# Patient Record
Sex: Female | Born: 1975 | Hispanic: Yes | Marital: Married | Smoking: Never smoker
Health system: Southern US, Community
[De-identification: ages and names within clinical notes are randomized; demographics above are authoritative.]

## PROBLEM LIST (undated history)

## (undated) DIAGNOSIS — R1900 Intra-abdominal and pelvic swelling, mass and lump, unspecified site: Secondary | ICD-10-CM

## (undated) DIAGNOSIS — I10 Essential (primary) hypertension: Secondary | ICD-10-CM

## (undated) DIAGNOSIS — K5900 Constipation, unspecified: Secondary | ICD-10-CM

## (undated) DIAGNOSIS — F329 Major depressive disorder, single episode, unspecified: Secondary | ICD-10-CM

## (undated) DIAGNOSIS — F32A Depression, unspecified: Secondary | ICD-10-CM

## (undated) DIAGNOSIS — O24419 Gestational diabetes mellitus in pregnancy, unspecified control: Secondary | ICD-10-CM

---

## 1997-12-25 ENCOUNTER — Encounter: Admission: RE | Admit: 1997-12-25 | Discharge: 1998-03-25 | Payer: Self-pay | Admitting: Obstetrics and Gynecology

## 1998-02-22 ENCOUNTER — Inpatient Hospital Stay (HOSPITAL_COMMUNITY): Admission: AD | Admit: 1998-02-22 | Discharge: 1998-02-22 | Payer: Self-pay | Admitting: Obstetrics & Gynecology

## 1998-02-23 ENCOUNTER — Inpatient Hospital Stay (HOSPITAL_COMMUNITY): Admission: AD | Admit: 1998-02-23 | Discharge: 1998-02-23 | Payer: Self-pay | Admitting: Obstetrics & Gynecology

## 1998-02-23 ENCOUNTER — Inpatient Hospital Stay (HOSPITAL_COMMUNITY): Admission: AD | Admit: 1998-02-23 | Discharge: 1998-02-26 | Payer: Self-pay | Admitting: *Deleted

## 2001-06-07 ENCOUNTER — Other Ambulatory Visit: Admission: RE | Admit: 2001-06-07 | Discharge: 2001-06-07 | Payer: Self-pay | Admitting: Obstetrics and Gynecology

## 2001-12-20 ENCOUNTER — Inpatient Hospital Stay (HOSPITAL_COMMUNITY): Admission: AD | Admit: 2001-12-20 | Discharge: 2001-12-20 | Payer: Self-pay | Admitting: *Deleted

## 2002-01-04 ENCOUNTER — Inpatient Hospital Stay (HOSPITAL_COMMUNITY): Admission: AD | Admit: 2002-01-04 | Discharge: 2002-01-06 | Payer: Self-pay | Admitting: *Deleted

## 2002-02-15 ENCOUNTER — Encounter: Admission: RE | Admit: 2002-02-15 | Discharge: 2002-02-15 | Payer: Self-pay | Admitting: *Deleted

## 2002-04-02 ENCOUNTER — Encounter: Admission: RE | Admit: 2002-04-02 | Discharge: 2002-04-02 | Payer: Self-pay | Admitting: *Deleted

## 2002-06-26 ENCOUNTER — Encounter: Admission: RE | Admit: 2002-06-26 | Discharge: 2002-06-26 | Payer: Self-pay | Admitting: Obstetrics and Gynecology

## 2002-09-25 ENCOUNTER — Encounter: Admission: RE | Admit: 2002-09-25 | Discharge: 2002-09-25 | Payer: Self-pay | Admitting: Obstetrics and Gynecology

## 2002-12-23 ENCOUNTER — Encounter: Admission: RE | Admit: 2002-12-23 | Discharge: 2002-12-23 | Payer: Self-pay | Admitting: Obstetrics and Gynecology

## 2003-03-18 ENCOUNTER — Encounter: Admission: RE | Admit: 2003-03-18 | Discharge: 2003-03-18 | Payer: Self-pay | Admitting: Obstetrics and Gynecology

## 2007-08-04 ENCOUNTER — Emergency Department (HOSPITAL_COMMUNITY): Admission: EM | Admit: 2007-08-04 | Discharge: 2007-08-04 | Payer: Self-pay | Admitting: Emergency Medicine

## 2009-05-13 ENCOUNTER — Encounter
Admission: RE | Admit: 2009-05-13 | Discharge: 2010-05-18 | Payer: Self-pay | Source: Home / Self Care | Attending: Obstetrics | Admitting: Obstetrics

## 2010-05-13 ENCOUNTER — Ambulatory Visit (HOSPITAL_COMMUNITY)
Admission: RE | Admit: 2010-05-13 | Discharge: 2010-05-13 | Payer: Self-pay | Source: Home / Self Care | Attending: Obstetrics | Admitting: Obstetrics

## 2010-07-21 ENCOUNTER — Inpatient Hospital Stay (HOSPITAL_COMMUNITY)
Admission: AD | Admit: 2010-07-21 | Discharge: 2010-07-21 | Disposition: A | Discharge status: Home / Self Care | Payer: Self-pay | Source: Ambulatory Visit | Attending: Obstetrics | Admitting: Obstetrics

## 2010-07-21 DIAGNOSIS — R030 Elevated blood-pressure reading, without diagnosis of hypertension: Secondary | ICD-10-CM | POA: Insufficient documentation

## 2010-07-21 DIAGNOSIS — O9989 Other specified diseases and conditions complicating pregnancy, childbirth and the puerperium: Secondary | ICD-10-CM

## 2010-07-21 DIAGNOSIS — O99891 Other specified diseases and conditions complicating pregnancy: Secondary | ICD-10-CM | POA: Insufficient documentation

## 2010-07-21 LAB — COMPREHENSIVE METABOLIC PANEL
ALT: 15 U/L (ref 0–35)
AST: 15 U/L (ref 0–37)
CO2: 23 mEq/L (ref 19–32)
Calcium: 9.1 mg/dL (ref 8.4–10.5)
GFR calc Af Amer: >60 mL/min (ref >60)
GFR calc non Af Amer: >60 mL/min (ref >60)
Sodium: 135 mEq/L (ref 135–145)

## 2010-07-21 LAB — CBC
Hemoglobin: 13.8 g/dL (ref 12.0–15.0)
MCHC: 34.2 g/dL (ref 30.0–36.0)
RBC: 4.22 MIL/uL (ref 3.87–5.11)
WBC: 9 10*3/uL (ref 4.0–10.5)

## 2010-07-28 ENCOUNTER — Inpatient Hospital Stay (HOSPITAL_COMMUNITY)
Admission: AD | Admit: 2010-07-28 | Discharge: 2010-07-28 | Disposition: A | Discharge status: Home / Self Care | Payer: Self-pay | Source: Ambulatory Visit | Attending: Obstetrics | Admitting: Obstetrics

## 2010-07-28 DIAGNOSIS — O139 Gestational [pregnancy-induced] hypertension without significant proteinuria, unspecified trimester: Secondary | ICD-10-CM | POA: Insufficient documentation

## 2010-07-28 LAB — COMPREHENSIVE METABOLIC PANEL
BUN: 7 mg/dL (ref 6–23)
CO2: 21 mEq/L (ref 19–32)
Chloride: 104 mEq/L (ref 96–112)
Creatinine, Ser: 0.53 mg/dL (ref 0.4–1.2)
GFR calc non Af Amer: >60 mL/min (ref >60)
Glucose, Bld: 70 mg/dL (ref 70–99)
Total Bilirubin: 0.2 mg/dL — ABNORMAL LOW (ref 0.3–1.2)

## 2010-07-28 LAB — CBC
Hemoglobin: 14.1 g/dL (ref 12.0–15.0)
MCH: 32.4 pg (ref 26.0–34.0)
MCV: 95.2 fL (ref 78.0–100.0)
Platelets: 196 10*3/uL (ref 150–400)
RBC: 4.35 MIL/uL (ref 3.87–5.11)

## 2010-07-28 LAB — URIC ACID: Uric Acid, Serum: 5.1 mg/dL (ref 2.4–7.0)

## 2010-08-05 ENCOUNTER — Inpatient Hospital Stay (HOSPITAL_COMMUNITY)
Admission: RE | Admit: 2010-08-05 | Discharge: 2010-08-07 | DRG: 774 | Disposition: A | Discharge status: Home / Self Care | Payer: Self-pay | Source: Ambulatory Visit | Attending: Obstetrics | Admitting: Obstetrics

## 2010-08-05 DIAGNOSIS — O2432 Unspecified pre-existing diabetes mellitus in childbirth: Principal | ICD-10-CM | POA: Diagnosis present

## 2010-08-05 DIAGNOSIS — E119 Type 2 diabetes mellitus without complications: Secondary | ICD-10-CM | POA: Diagnosis present

## 2010-08-05 LAB — CBC
HCT: 40 % (ref 36.0–46.0)
Hemoglobin: 13.7 g/dL (ref 12.0–15.0)
MCH: 32.5 pg (ref 26.0–34.0)
MCHC: 34.3 g/dL (ref 30.0–36.0)
MCV: 95 fL (ref 78.0–100.0)
RBC: 4.21 MIL/uL (ref 3.87–5.11)
WBC: 10.2 10*3/uL (ref 4.0–10.5)

## 2010-08-05 LAB — GLUCOSE, RANDOM: Glucose, Bld: 131 mg/dL — ABNORMAL HIGH (ref 70–99)

## 2010-08-07 LAB — CBC
Hemoglobin: 12.1 g/dL (ref 12.0–15.0)
MCV: 96.5 fL (ref 78.0–100.0)
Platelets: 164 10*3/uL (ref 150–400)
RBC: 3.76 MIL/uL — ABNORMAL LOW (ref 3.87–5.11)
WBC: 8.3 10*3/uL (ref 4.0–10.5)

## 2010-08-09 ENCOUNTER — Inpatient Hospital Stay (HOSPITAL_COMMUNITY): Admission: AD | Admit: 2010-08-09 | Payer: Self-pay | Source: Ambulatory Visit | Admitting: Obstetrics

## 2010-09-19 ENCOUNTER — Emergency Department (HOSPITAL_COMMUNITY)
Admission: EM | Admit: 2010-09-19 | Discharge: 2010-09-19 | Disposition: A | Discharge status: Home / Self Care | Payer: Self-pay | Attending: Emergency Medicine | Admitting: Emergency Medicine

## 2010-09-19 DIAGNOSIS — Z79899 Other long term (current) drug therapy: Secondary | ICD-10-CM | POA: Insufficient documentation

## 2010-09-19 DIAGNOSIS — R05 Cough: Secondary | ICD-10-CM | POA: Insufficient documentation

## 2010-09-19 DIAGNOSIS — R070 Pain in throat: Secondary | ICD-10-CM | POA: Insufficient documentation

## 2010-09-19 DIAGNOSIS — J069 Acute upper respiratory infection, unspecified: Secondary | ICD-10-CM | POA: Insufficient documentation

## 2010-09-19 DIAGNOSIS — R490 Dysphonia: Secondary | ICD-10-CM | POA: Insufficient documentation

## 2010-09-19 DIAGNOSIS — R059 Cough, unspecified: Secondary | ICD-10-CM | POA: Insufficient documentation

## 2010-09-19 DIAGNOSIS — I10 Essential (primary) hypertension: Secondary | ICD-10-CM | POA: Insufficient documentation

## 2010-09-19 DIAGNOSIS — H9209 Otalgia, unspecified ear: Secondary | ICD-10-CM | POA: Insufficient documentation

## 2011-01-14 LAB — PROCEDURE REPORT - SCANNED: Pap: NEGATIVE

## 2011-11-22 ENCOUNTER — Encounter (HOSPITAL_COMMUNITY): Payer: Self-pay | Admitting: Emergency Medicine

## 2011-11-22 ENCOUNTER — Emergency Department (HOSPITAL_COMMUNITY): Payer: Self-pay

## 2011-11-22 ENCOUNTER — Emergency Department (HOSPITAL_COMMUNITY)
Admission: EM | Admit: 2011-11-22 | Discharge: 2011-11-22 | Disposition: A | Discharge status: Home / Self Care | Payer: Self-pay | Attending: Emergency Medicine | Admitting: Emergency Medicine

## 2011-11-22 DIAGNOSIS — I10 Essential (primary) hypertension: Secondary | ICD-10-CM | POA: Insufficient documentation

## 2011-11-22 DIAGNOSIS — R109 Unspecified abdominal pain: Secondary | ICD-10-CM

## 2011-11-22 DIAGNOSIS — N83209 Unspecified ovarian cyst, unspecified side: Secondary | ICD-10-CM | POA: Insufficient documentation

## 2011-11-22 DIAGNOSIS — N83201 Unspecified ovarian cyst, right side: Secondary | ICD-10-CM

## 2011-11-22 HISTORY — DX: Essential (primary) hypertension: I10

## 2011-11-22 LAB — URINE MICROSCOPIC-ADD ON

## 2011-11-22 LAB — WET PREP, GENITAL: Yeast Wet Prep HPF POC: NONE SEEN

## 2011-11-22 LAB — POCT I-STAT, CHEM 8
Calcium, Ion: 1.17 mmol/L (ref 1.12–1.23)
Glucose, Bld: 101 mg/dL — ABNORMAL HIGH (ref 70–99)
HCT: 43 % (ref 36.0–46.0)
TCO2: 20 mmol/L (ref 0–100)

## 2011-11-22 LAB — URINALYSIS, ROUTINE W REFLEX MICROSCOPIC
Bilirubin Urine: NEGATIVE
Glucose, UA: NEGATIVE mg/dL
Glucose, UA: NEGATIVE mg/dL
Leukocytes, UA: NEGATIVE
Specific Gravity, Urine: 1.03 (ref 1.005–1.030)
pH: 6.5 (ref 5.0–8.0)
pH: 6 (ref 5.0–8.0)

## 2011-11-22 LAB — CBC WITH DIFFERENTIAL/PLATELET
Basophils Absolute: 0 10*3/uL (ref 0.0–0.1)
Basophils Relative: 0 % (ref 0–1)
Eosinophils Relative: 0 % (ref 0–5)
HCT: 38.8 % (ref 36.0–46.0)
Lymphocytes Relative: 9 % — ABNORMAL LOW (ref 12–46)
MCHC: 35.1 g/dL (ref 30.0–36.0)
Monocytes Absolute: 0.6 10*3/uL (ref 0.1–1.0)
Neutro Abs: 14.8 10*3/uL — ABNORMAL HIGH (ref 1.7–7.7)
Platelets: 212 10*3/uL (ref 150–400)
RDW: 13.4 % (ref 11.5–15.5)
WBC: 17 10*3/uL — ABNORMAL HIGH (ref 4.0–10.5)

## 2011-11-22 LAB — POCT PREGNANCY, URINE: Preg Test, Ur: NEGATIVE

## 2011-11-22 MED ORDER — HYDROCODONE-ACETAMINOPHEN 5-325 MG PO TABS: ORAL_TABLET | ORAL | Status: AC

## 2011-11-22 MED ORDER — IOHEXOL 300 MG/ML  SOLN
100.0000 mL | Freq: Once | INTRAMUSCULAR | Status: AC | PRN
  Administered 2011-11-22: 100 mL via INTRAVENOUS

## 2011-11-22 MED ORDER — LIDOCAINE HCL 1 % IJ SOLN
INTRAMUSCULAR | Status: AC
  Administered 2011-11-22: 20 mL
  Filled 2011-11-22: qty 20

## 2011-11-22 MED ORDER — CEFTRIAXONE SODIUM 250 MG IJ SOLR
250.0000 mg | Freq: Once | INTRAMUSCULAR | Status: AC
  Administered 2011-11-22: 250 mg via INTRAMUSCULAR
  Filled 2011-11-22: qty 250

## 2011-11-22 MED ORDER — IBUPROFEN 800 MG PO TABS
800.0000 mg | ORAL_TABLET | Freq: Once | ORAL | Status: AC
  Administered 2011-11-22: 800 mg via ORAL
  Filled 2011-11-22: qty 1

## 2011-11-22 MED ORDER — AZITHROMYCIN 250 MG PO TABS: ORAL_TABLET | ORAL | Status: AC

## 2011-11-22 MED ORDER — AZITHROMYCIN 250 MG PO TABS
1000.0000 mg | ORAL_TABLET | Freq: Once | ORAL | Status: AC
  Administered 2011-11-22: 1000 mg via ORAL
  Filled 2011-11-22: qty 4

## 2011-11-22 NOTE — ED Provider Notes (Signed)
Medical screening examination/treatment/procedure(s) were performed by non-physician practitioner and as supervising physician I was immediately available for consultation/collaboration.  Jasmine Awe, MD 11/22/11 [phone]

## 2011-11-22 NOTE — ED Provider Notes (Signed)
History     CSN: [id number]  Arrival date & time 11/22/11  4098   First MD Initiated Contact with Patient 11/22/11 0324      Chief Complaint  Patient presents with  . Abdominal Pain    (Consider location/radiation/quality/duration/timing/severity/associated sxs/prior treatment) HPI  Past Medical History  Diagnosis Date  . Hypertension     History reviewed. No pertinent past surgical history.  History reviewed. No pertinent family history.  History  Substance Use Topics  . Smoking status: Never Smoker   . Smokeless tobacco: Not on file  . Alcohol Use: No    OB History    Grav Para Term Preterm Abortions TAB SAB Ect Mult Living                  Review of Systems  Allergies  Review of patient's allergies indicates no known allergies.  Home Medications   Current Outpatient Rx  Name Route Sig Dispense Refill  . HYDROCHLOROTHIAZIDE 12.5 MG PO CAPS Oral Take 12.5 mg by mouth daily.      BP 121/79  Pulse 71  Temp 97.6 F (36.4 C) (Oral)  Resp 20  SpO2 97%  LMP 11/20/2011  Physical Exam  ED Course  Procedures (including critical care time)  Labs Reviewed  URINALYSIS, ROUTINE W REFLEX MICROSCOPIC - Abnormal; Notable for the following:    APPearance CLOUDY (*)     Hgb urine dipstick LARGE (*)     Ketones, ur TRACE (*)     Leukocytes, UA MODERATE (*)     All other components within normal limits  URINE MICROSCOPIC-ADD ON - Abnormal; Notable for the following:    Squamous Epithelial / LPF FEW (*)     All other components within normal limits  POCT I-STAT, CHEM 8 - Abnormal; Notable for the following:    Potassium 3.3 (*)     Glucose, Bld 101 (*)     All other components within normal limits  CBC WITH DIFFERENTIAL - Abnormal; Notable for the following:    WBC 17.0 (*)     Neutrophils Relative 88 (*)     Neutro Abs 14.8 (*)     Lymphocytes Relative 9 (*)     All other components within normal limits  WET PREP, GENITAL - Abnormal; Notable for the  following:    Clue Cells Wet Prep HPF POC FEW (*)     WBC, Wet Prep HPF POC MANY (*)     All other components within normal limits  URINALYSIS, ROUTINE W REFLEX MICROSCOPIC - Abnormal; Notable for the following:    Hgb urine dipstick TRACE (*)     All other components within normal limits  URINE MICROSCOPIC-ADD ON - Abnormal; Notable for the following:    Squamous Epithelial / LPF FEW (*)     All other components within normal limits  POCT PREGNANCY, URINE  GC/CHLAMYDIA PROBE AMP, GENITAL   Ct Abdomen Pelvis W Contrast  11/22/2011  *RADIOLOGY REPORT*  Clinical Data: Lower abdominal pain.  Nausea.  CT ABDOMEN AND PELVIS WITH CONTRAST  Technique:  Multidetector CT imaging of the abdomen and pelvis was performed following the standard protocol during bolus administration of intravenous contrast.  Contrast: OMNIPAQUE IOHEXOL 300 MG/ML  SOLN  Comparison: None.  Findings: There is a 3.5 cm cyst on the left ovary with an oblong 2.1 x 1.0 cm cyst also in the left ovary.  There is a 2.4 cm cyst on the right ovary. There is edema and increased  vascularity in the adjacent omentum particularly slightly to the right.  There is no focal nodularity.  The adjacent sigmoid portion of the colon, uterus, and bladder demonstrate no significant abnormalities.  No osseous abnormality.  Liver, spleen, pancreas, and adrenal glands are normal.  13 mm cyst in the lower pole of the right kidney.  Tiny stone in the upper pole of the right kidney.  The bowel is normal including the terminal ileum and appendix.  IMPRESSION:  1.  Abnormal appearance of both ovaries with multiple cystic lesions. 2.  Abnormal soft tissue stranding and prominent vascularity in the omentum adjacent to the ovaries and uterus. 3.  I recommend correlating the abnormalities with ovarian tumor markers.  Does the patient have any symptoms of pelvic inflammatory disease?  Original Report Authenticated By: Gwynn Burly, M.D.     1. Abdominal pain    2. Bilateral ovarian cysts     6:53 AM Handoff from [facility]. Pending in and out cath to test urine (patient is menstruating). Pt has WBC count, no cervical motion or adnexal tenderness. If UA does not demonstrate infection to explain WBC count, will need CT abd/pelvis.   Vital signs reviewed and are as follows: Filed Vitals:   11/22/11 0157  BP: 121/79  Pulse: 71  Temp: 97.6 F (36.4 C)  Resp: 20   7:32 AM Patient seen. Informed of need for CT. She states her pain is still there, but mild. She states she is comfortable and does not want additional medicine.   7:33 AM Exam:  Gen NAD; Heart RRR, nml S1,S2, no m/r/g; Lungs CTAB; Abd soft, NT, no rebound or guarding; Ext 2+ pedal pulses bilaterally, no edema.  10:50 AM CT reviewed. Patient informed of results. She has a doctor and will follow-up regarding CT findings. Patient appears well and she is eating a sandwich in room. Instructed to take additional dose azithro in 1 week. She understands.   10:52 AM The patient was urged to return to the Emergency Department immediately with worsening of current symptoms, worsening abdominal pain, persistent vomiting, blood noted in stools, fever, or any other concerns. The patient verbalized understanding.   10:52 AM Patient counseled on use of narcotic pain medications. Counseled not to combine these medications with others containing tylenol. Urged not to drink alcohol, drive, or perform any other activities that requires focus while taking these medications. The patient verbalizes understanding and agrees with the plan.    MDM  Ovarian cysts, possible PID on CT. GYN follow-up indicated given ovarian cysts. Will treat as PID given CT findings. Patient much improved in ED, she appears well. D/c to home.         Renne Crigler, [place] 11/22/11 1105

## 2011-11-22 NOTE — ED Notes (Signed)
Pt is c/o lower abd pain that started a couple hours ago   Pt is currently on her period states it started 2 days ago  Pt states it is normal for her   Denies constipation or problems urinating  Pt states she has been sweating with the pain   Pt is crying in triage

## 2011-11-22 NOTE — ED Provider Notes (Signed)
History     CSN: [id number]  Arrival date & time 11/22/11  1191   First MD Initiated Contact with Patient 11/22/11 0324      Chief Complaint  Patient presents with  . Abdominal Pain    (Consider location/radiation/quality/duration/timing/severity/associated sxs/prior treatment) HPI History from patient. 36 year old female who presents with abdominal pain. She reports this started suddenly while at home this evening. Pain is located to the suprapubic area and does not seem to radiate. It is sharp in nature. Worsens with palpation over the area and improves with rest. No treatment prior to coming here. She has had associated nausea. Denies vomiting, changes in bowel movements, urinary symptoms, vaginal discharge. She is currently menstruating. No fever or chills or changes in appetite. No history of abdominal surgeries.  Past Medical History  Diagnosis Date  . Hypertension     History reviewed. No pertinent past surgical history.  History reviewed. No pertinent family history.  History  Substance Use Topics  . Smoking status: Never Smoker   . Smokeless tobacco: Not on file  . Alcohol Use: No    OB History    Grav Para Term Preterm Abortions TAB SAB Ect Mult Living                  Review of Systems as per history of present illness, 10 point review of systems otherwise negative  Allergies  Review of patient's allergies indicates no known allergies.  Home Medications   Current Outpatient Rx  Name Route Sig Dispense Refill  . HYDROCHLOROTHIAZIDE 12.5 MG PO CAPS Oral Take 12.5 mg by mouth daily.      BP 121/79  Pulse 71  Temp 97.6 F (36.4 C) (Oral)  Resp 20  SpO2 97%  LMP 11/20/2011  Physical Exam  Nursing note and vitals reviewed. Constitutional: She appears well-developed and well-nourished. No distress.  HENT:  Head: Normocephalic and atraumatic.  Eyes:       Normal appearance  Neck: Normal range of motion.  Cardiovascular: Normal rate, regular  rhythm and normal heart sounds.   Pulmonary/Chest: Effort normal and breath sounds normal. She exhibits no tenderness.  Abdominal: Soft. Bowel sounds are normal.       Tenderness to suprapubic area. No tenderness at McBurney's point. No rebound or guarding.  Genitourinary:       Chaperone present during exam. Normal external genitalia. Moderate amount of blood in the vaginal vault. No cervical motion tenderness. Tender to palpation on bimanual at the midline. No adnexal tenderness or masses.  Musculoskeletal: Normal range of motion.  Neurological: She is alert.  Skin: Skin is warm and dry. She is not diaphoretic.  Psychiatric: She has a normal mood and affect.    ED Course  Procedures (including critical care time)  Labs Reviewed  URINALYSIS, ROUTINE W REFLEX MICROSCOPIC - Abnormal; Notable for the following:    APPearance CLOUDY (*)     Hgb urine dipstick LARGE (*)     Ketones, ur TRACE (*)     Leukocytes, UA MODERATE (*)     All other components within normal limits  URINE MICROSCOPIC-ADD ON - Abnormal; Notable for the following:    Squamous Epithelial / LPF FEW (*)     All other components within normal limits  POCT I-STAT, CHEM 8 - Abnormal; Notable for the following:    Potassium 3.3 (*)     Glucose, Bld 101 (*)     All other components within normal limits  CBC WITH DIFFERENTIAL -  Abnormal; Notable for the following:    WBC 17.0 (*)     Neutrophils Relative 88 (*)     Neutro Abs 14.8 (*)     Lymphocytes Relative 9 (*)     All other components within normal limits  POCT PREGNANCY, URINE  WET PREP, GENITAL  GC/CHLAMYDIA PROBE AMP, GENITAL  URINALYSIS, ROUTINE W REFLEX MICROSCOPIC   No results found.   No diagnosis found.    MDM  Patient presents with sudden onset of suprapubic pain at the midline. This started just prior to arrival. Has been associated with nausea. Patient does have a leukocytosis of 17,000. She does have some white cells in her urine sample but  the urine sample is also contaminated by menstrual blood. Plan to perform in and out catheterization on the patient to obtain "clean" urine sample. If this shows obvious evidence of a urinary tract infection, feel the patient can be treated for this. If the patient has no obvious evidence of UTI, feel she will require imaging to rule out other intra-abdominal pathology. Signed out to [place], PA-C.       Grant Fontana, PA-C 11/22/11 [phone]

## 2011-11-22 NOTE — ED Notes (Signed)
PT. IN ct

## 2011-12-02 NOTE — ED Provider Notes (Signed)
Medical screening examination/treatment/procedure(s) were performed by non-physician practitioner and as supervising physician I was immediately available for consultation/collaboration.  Kani Jobson K Elody Kleinsasser-Rasch, MD 12/02/11 2312 

## 2011-12-15 ENCOUNTER — Other Ambulatory Visit: Payer: Self-pay | Admitting: Gynecologic Oncology

## 2011-12-15 ENCOUNTER — Other Ambulatory Visit: Payer: Self-pay | Admitting: *Deleted

## 2011-12-15 ENCOUNTER — Ambulatory Visit: Payer: Self-pay | Attending: Gynecologic Oncology | Admitting: Gynecologic Oncology

## 2011-12-15 ENCOUNTER — Encounter: Payer: Self-pay | Admitting: Gynecologic Oncology

## 2011-12-15 VITALS — BP 128/60 | HR 76 | Temp 97.7°F | Resp 18 | Ht 63.19 in | Wt 206.1 lb

## 2011-12-15 DIAGNOSIS — N83209 Unspecified ovarian cyst, unspecified side: Secondary | ICD-10-CM | POA: Insufficient documentation

## 2011-12-15 DIAGNOSIS — I10 Essential (primary) hypertension: Secondary | ICD-10-CM | POA: Insufficient documentation

## 2011-12-15 DIAGNOSIS — R971 Elevated cancer antigen 125 [CA 125]: Secondary | ICD-10-CM

## 2011-12-15 NOTE — Progress Notes (Signed)
Consult Note: Gyn-Onc  Consult was requested by Dr. Mickel Baas for the evaluation of Brittany [name] 36 y.o. female  CC:  Chief Complaint  Patient presents with  . Ovarian Cyst    New Consult  . Elevated Ca 125    HPI: 36 y/o G3P3 Patient's last menstrual period was 11/20/2011. presented to the ER 11/22/2011 for abdominal pain in the lower abdomen and periumblilical area for one day. Patient denies nausea or vomiting,  Reported fevers and nervousness at the time of presentation.  No diarrhea or constipation. WBC was 17K Patient admitted overnight and discharged. Denies vaginal discharge at the time of persentation, GC and chlymidia subsequently negative.    A CT of the abdomen and pelvis was notable for a 3.5 cm cyst on the left ovary with an oblong 2.1 x 1.0 cm cyst also in the left ovary. There is a 2.4 cm cyst on the right ovary. There is edema and increased vascularity in the adjacent omentum particularly slightly to the right. There is no focal nodularity.    Interval History:Patient without pain since, denies any further febrile episodes.    Current Meds:  Outpatient Encounter Prescriptions as of 12/15/2011  Medication Sig Dispense Refill  . hydrochlorothiazide (MICROZIDE) 12.5 MG capsule Take 12.5 mg by mouth daily.        Allergy: No Known Allergies  Social Hx:   History   Social History  . Marital Status: Married    Spouse Name: N/A    Number of Children: N/A  . Years of Education: N/A   Occupational History  . Not on file.   Social History Main Topics  . Smoking status: Never Smoker   . Smokeless tobacco: Not on file  . Alcohol Use: No  . Drug Use: No  . Sexually Active:    Other Topics Concern  . Not on file   Social History Narrative  . No narrative on file    Past Surgical Hx: No past surgical history on file.  Past Medical Hx:  Past Medical History  Diagnosis Date  . Hypertension     Past Gynecological History: G3P3 NSVD x 3.  Menarche 14  every 28 days.  Uses condoms.  Used OCP and Depo with significant weight gain. Last pap 2012 wnl. Patient's last menstrual period was 11/20/2011.  Family Hx: No family history on file.  Review of Systems:  Constitutional  Feels well,Cardiovascular  No chest pain, shortness of breath, or edema  Pulmonary  No cough or wheeze.  Gastro Intestinal  No nausea, vomitting, or diarrhoea. No bright red blood per rectum, no abdominal pain, change in bowel movement, or constipation.  Genito Urinary  No frequency, urgency, dysuria, no vaginal bleeding or discharge Musculo Skeletal  No myalgia, arthralgia, joint swelling or pain  Neurologic  No weakness, numbness, change in gait,  Psychology  No depression, anxiety, insomnia.   Vitals:  Blood pressure 128/60, pulse 76, temperature 97.7 F (36.5 C), temperature source Oral, resp. rate 18, height 5' 3.19" (1.605 m), weight 206 lb 1.6 oz (93.486 kg), last menstrual period 11/20/2011.  Physical Exam: WD in NAD Neck  Supple NROM, without any enlargements.  Lymph Node Survey No cervical supraclavicular or inguinal adenopathy Cardiovascular  Pulse normal rate, regularity and rhythm.  Lungs  Clear to auscultation bilateraly, without wheezes/crackles/rhonchi. Psychiatry  Alert and oriented to person, place, and time  Abdomen  Normoactive bowel sounds, abdomen soft, non-tender and obese.   Back No CVA tenderness Genito Urinary  Vulva/vagina:  Normal external female genitalia.  No lesions. No discharge or bleeding.  Bladder/urethra:  No lesions or masses  Vagina: well estrogenized, no discharge or bleeding, no CMT  Cervix: Normal appearing, no lesions.  Uterus: Small, mobile, no parametrial involvement or nodularity.  Adnexa: No palpable masses, no cul de sac nodularity. Rectal  Good tone, no masses no cul de sac nodularity.  Extremities  No bilateral cyanosis, clubbing or edema.   Assessment/Plan:  Ms. Brittany [name]  is a 36  y.o.  year old with a CT scan on day 2 of her cycle notable to two oblong cystic ovarian structures and omental edema and avscularity.  WBC at the time of presentation was 17K ad CA 125 elevated to 64.  Transvaginal UTZ on day 10 of the next menstrual cycle 12/20/2011 Repeat CA 125 and CBC on day 10 of her cycle. F/U with GYN Onc 12/20/2011.     Laurette Schimke, MD, PhD 12/15/2011, 9:22 AM

## 2011-12-15 NOTE — Patient Instructions (Addendum)
  Transvaginal UTZ on day 10 of the next menstrual cycle 12/20/2011 Repeat CA 125 and CBC on day 10 of her cycle. F/U with GYN Onc 12/20/2011.      Thank you very much Ms. Faiza Saldana-garcia for allowing me to provide care for you today.  I appreciate your confidence in choosing our Gynecologic Oncology team.  If you have any questions about your visit today please call our office and we will get back to you as soon as possible.  Maryclare Labrador. Gerrad Welker MD., PhD Gynecologic Oncology

## 2011-12-20 ENCOUNTER — Other Ambulatory Visit: Payer: Self-pay | Admitting: *Deleted

## 2011-12-20 ENCOUNTER — Ambulatory Visit: Payer: Self-pay

## 2011-12-20 ENCOUNTER — Ambulatory Visit: Payer: Self-pay | Attending: Gynecologic Oncology | Admitting: Gynecologic Oncology

## 2011-12-20 ENCOUNTER — Ambulatory Visit (HOSPITAL_COMMUNITY)
Admission: RE | Admit: 2011-12-20 | Discharge: 2011-12-20 | Disposition: A | Discharge status: Home / Self Care | Payer: Self-pay | Source: Ambulatory Visit | Attending: Gynecologic Oncology | Admitting: Gynecologic Oncology

## 2011-12-20 ENCOUNTER — Ambulatory Visit (HOSPITAL_COMMUNITY): Payer: Self-pay

## 2011-12-20 ENCOUNTER — Encounter: Payer: Self-pay | Admitting: Gynecologic Oncology

## 2011-12-20 VITALS — BP 138/78 | HR 84 | Temp 98.0°F | Resp 16 | Ht 63.19 in | Wt 206.1 lb

## 2011-12-20 DIAGNOSIS — I10 Essential (primary) hypertension: Secondary | ICD-10-CM | POA: Insufficient documentation

## 2011-12-20 DIAGNOSIS — N83209 Unspecified ovarian cyst, unspecified side: Secondary | ICD-10-CM | POA: Insufficient documentation

## 2011-12-20 LAB — CBC WITH DIFFERENTIAL/PLATELET
Basophils Absolute: 0.1 10*3/uL (ref 0.0–0.1)
Eosinophils Absolute: 0.1 10*3/uL (ref 0.0–0.5)
HCT: 41 % (ref 34.8–46.6)
HGB: 13.7 g/dL (ref 11.6–15.9)
LYMPH%: 34.4 % (ref 14.0–49.7)
MCV: 97 fL (ref 79.5–101.0)
MONO%: 7.1 % (ref 0.0–14.0)
NEUT#: 4.3 10*3/uL (ref 1.5–6.5)
Platelets: 222 10*3/uL (ref 145–400)

## 2011-12-20 NOTE — Patient Instructions (Addendum)
Minimally invasive left salpingo-oophorectomy on 01/17/2012.   Thank you very much Ms. Esmeralda Saldana-garcia for allowing me to provide care for you today.  I appreciate your confidence in choosing our Gynecologic Oncology team.  If you have any questions about your visit today please call our office and we will get back to you as soon as possible.  Maryclare Labrador. Cassi Jenne MD., PhD Gynecologic Oncology

## 2011-12-20 NOTE — Progress Notes (Signed)
Office Visit: Gyn-Onc  CC:  Chief Complaint  Patient presents with  . Ovarian Cyst    Follow up    HPI: 35 y/o G3P3 Patient's last menstrual period was 11/20/2011. presented to the ER 11/22/2011 for abdominal pain in the lower abdomen and periumblilical area for one day. Patient denies nausea or vomiting,  Reported fevers and nervousness at the time of presentation.  No diarrhea or constipation. WBC was 17K Patient admitted overnight and discharged. Denies vaginal discharge at the time of persentation, GC and chlymidia subsequently negative.    A CT of the abdomen and pelvis was notable for a 3.5 cm cyst on the left ovary with an oblong 2.1 x 1.0 cm cyst also in the left ovary. There is a 2.4 cm cyst on the right ovary. There is edema and increased vascularity in the adjacent omentum particularly slightly to the right. There is no focal nodularity.    Interval History: Patient without pain since, denies any further febrile episodes.  Repeat CA 125 returned 14.3.  white blood cell count 7.6K.  Repeat UTZ persistent 4.1 cm complex cyst within the left ovary with layering debris level. Differential considerations would include hemorrhagic cyst, endometrioma or tubo-ovarian abscess. Less likely cystic ovarian neoplasm, but this cannot be completely excluded.  Current Meds:  Outpatient Encounter Prescriptions as of 12/20/2011  Medication Sig Dispense Refill  . hydrochlorothiazide (MICROZIDE) 12.5 MG capsule Take 12.5 mg by mouth daily.        Allergy: No Known Allergies  Social Hx:   History   Social History  . Marital Status: Married    Spouse Name: N/A    Number of Children: N/A  . Years of Education: N/A   Occupational History  . Not on file.   Social History Main Topics  . Smoking status: Never Smoker   . Smokeless tobacco: Not on file  . Alcohol Use: No  . Drug Use: No  . Sexually Active:    Other Topics Concern  . Not on file   Social History Narrative  . No narrative on  file    Past Surgical Hx: No past surgical history on file.  Past Medical Hx:  Past Medical History  Diagnosis Date  . Hypertension     Past Gynecological History: G3P3 NSVD x 3.  Menarche 14 every 28 days.  Uses condoms.  Used OCP and Depo with significant weight gain. Last pap 2012 wnl.   Family Hx: No family history on file.  Review of Systems:  Constitutional  Feels well,Cardiovascular  No chest pain, shortness of breath, or edema  Pulmonary  No cough or wheeze.  Gastro Intestinal  No nausea, vomitting, or diarrhoea. No bright red blood per rectum, no abdominal pain, change in bowel movement, or constipation.  Genito Urinary  No frequency, urgency, dysuria, no vaginal bleeding or discharge Musculo Skeletal  No myalgia, arthralgia, joint swelling or pain  Neurologic  No weakness, numbness, change in gait,  Psychology  No depression, anxiety, insomnia.   Vitals:  Blood pressure 138/78, pulse 84, temperature 98 F (36.7 C), temperature source Oral, resp. rate 16, height 5' 3.19" (1.605 m), weight 206 lb 1.6 oz (93.486 kg), last menstrual period 11/20/2011.  Physical Exam: WD in NAD Neck  Supple NROM, without any enlargements.  Lymph Node Survey No cervical supraclavicular or inguinal adenopathy Cardiovascular  Pulse normal rate, regularity and rhythm.  Lungs  Clear to auscultation bilateraly, without wheezes/crackles/rhonchi. Psychiatry  Alert and oriented to person, place, and   time  Abdomen  Normoactive bowel sounds, abdomen soft, non-tender and obese.   Back No CVA tenderness Genito Urinary  Vulva/vagina: Normal external female genitalia.  No lesions. No discharge or bleeding.  Bladder/urethra:  No lesions or masses  Vagina: well estrogenized, no discharge or bleeding, no CMT  Cervix: Normal appearing, no lesions.  Uterus: Small, mobile, no parametrial involvement or nodularity.  Adnexa: No palpable masses, no cul de sac nodularity. Rectal  Good tone, no  masses no cul de sac nodularity.  Extremities  No bilateral cyanosis, clubbing or edema.   Assessment/Plan:  Ms. Brittany [name]  is a 35 y.o.  year old with a CT scan on day 2 of her cycle notable to two oblong cystic ovarian structures and omental edema and avscularity.  WBC at the time of presentation was 17K ad CA 125 elevated to 64.  Repeat CA 125 and WBC are within normal limits. Repeat transvaginal ultrasound demonstrates a presence of a persistent left adnexal mass. The differential diagnosis is an endometrioma versus tubo-ovarian abscess.  Options presented to the patient were that of repeat ultrasound and intervention in 2 months versus, unilateral salpingo-oophorectomy. The pain was so significant that the patient at this time is very apprehensive of that repeat the discomfort and opts for minimally invasive removal of the left ovary and tube.  The risks and benefits of the procedure were discussed with the patient and the risks discussed were inclusive of infection bleeding damage to surrounding structures prolonged hospitalization and reoperation.  The procedure will likely occur on 01/17/2012  [name], MD, PhD 12/20/2011, 5:09 PM   

## 2012-01-13 ENCOUNTER — Encounter (HOSPITAL_COMMUNITY): Payer: Self-pay | Admitting: Pharmacy Technician

## 2012-01-13 ENCOUNTER — Encounter (HOSPITAL_COMMUNITY)
Admission: RE | Admit: 2012-01-13 | Discharge: 2012-01-13 | Disposition: A | Discharge status: Home / Self Care | Payer: Self-pay | Source: Ambulatory Visit | Attending: Gynecologic Oncology | Admitting: Gynecologic Oncology

## 2012-01-13 ENCOUNTER — Ambulatory Visit (HOSPITAL_COMMUNITY)
Admission: RE | Admit: 2012-01-13 | Discharge: 2012-01-13 | Disposition: A | Discharge status: Home / Self Care | Payer: Self-pay | Source: Ambulatory Visit | Attending: Gynecologic Oncology | Admitting: Gynecologic Oncology

## 2012-01-13 ENCOUNTER — Encounter (HOSPITAL_COMMUNITY): Payer: Self-pay

## 2012-01-13 DIAGNOSIS — Z01810 Encounter for preprocedural cardiovascular examination: Secondary | ICD-10-CM | POA: Insufficient documentation

## 2012-01-13 DIAGNOSIS — I10 Essential (primary) hypertension: Secondary | ICD-10-CM | POA: Insufficient documentation

## 2012-01-13 DIAGNOSIS — Z01812 Encounter for preprocedural laboratory examination: Secondary | ICD-10-CM | POA: Insufficient documentation

## 2012-01-13 HISTORY — DX: Gestational diabetes mellitus in pregnancy, unspecified control: O24.419

## 2012-01-13 HISTORY — DX: Intra-abdominal and pelvic swelling, mass and lump, unspecified site: R19.00

## 2012-01-13 HISTORY — DX: Constipation, unspecified: K59.00

## 2012-01-13 LAB — CBC WITH DIFFERENTIAL/PLATELET
Basophils Absolute: 0 10*3/uL (ref 0.0–0.1)
Basophils Relative: 0 % (ref 0–1)
HCT: 40.1 % (ref 36.0–46.0)
Lymphocytes Relative: 39 % (ref 12–46)
MCHC: 34.2 g/dL (ref 30.0–36.0)
Monocytes Absolute: 0.5 10*3/uL (ref 0.1–1.0)
Neutro Abs: 3.8 10*3/uL (ref 1.7–7.7)
Neutrophils Relative %: 53 % (ref 43–77)
Platelets: 270 10*3/uL (ref 150–400)
RDW: 13.2 % (ref 11.5–15.5)
WBC: 7.2 10*3/uL (ref 4.0–10.5)

## 2012-01-13 LAB — COMPREHENSIVE METABOLIC PANEL
ALT: 14 U/L (ref 0–35)
AST: 16 U/L (ref 0–37)
Albumin: 3.7 g/dL (ref 3.5–5.2)
Calcium: 9.4 mg/dL (ref 8.4–10.5)
Chloride: 101 mEq/L (ref 96–112)
Creatinine, Ser: 0.58 mg/dL (ref 0.50–1.10)
Sodium: 135 mEq/L (ref 135–145)
Total Bilirubin: 0.3 mg/dL (ref 0.3–1.2)

## 2012-01-13 LAB — SURGICAL PCR SCREEN: MRSA, PCR: NEGATIVE

## 2012-01-13 NOTE — Patient Instructions (Addendum)
20 Brittany [name]  01/13/2012   Your procedure is scheduled on: 01/17/12 AT 7:15 AM  Report to SHORT STAY DEPT  At  5:15  AM.  Call this number if you have problems the morning of surgery: [phone]   Remember:   Do not eat food or drink liquids AFTER MIDNIGHT    Take these medicines the morning of surgery with A SIP OF WATER: NONE   Do not wear jewelry, make-up or nail polish.  Do not wear lotions, powders, or perfumes.   Do not shave legs or underarms 48 hrs. before surgery (men may shave face)  Do not bring valuables to the hospital.  Contacts, dentures or bridgework may not be worn into surgery.  Leave suitcase in the car. After surgery it may be brought to your room.  For patients admitted to the hospital, checkout time is 11:00 AM the day of discharge.   Patients discharged the day of surgery will not be allowed to drive home. If going home same day of surgery, must have someone stay with you first 24 hrs at home and arrange for some one to drive you home from hospital.    Special Instructions:   Please read over the following fact sheets that you were given: MRSA  Information               SHOWER WITH BETASEPT THE NIGHT BEFORE SURGERY AND THE MORNING OF SURGERY          X_________________________________________________________________________________________________20 Brittany [name]  01/13/2012

## 2012-01-16 NOTE — Anesthesia Preprocedure Evaluation (Addendum)
Anesthesia Evaluation  Patient identified by MRN, date of birth, ID band Patient awake    Reviewed: Allergy & Precautions, H&P , NPO status , Patient's Chart, lab work & pertinent test results  Airway Mallampati: II TM Distance: >3 FB Neck ROM: full    Dental No notable dental hx.    Pulmonary neg pulmonary ROS,  breath sounds clear to auscultation  Pulmonary exam normal       Cardiovascular Exercise Tolerance: Good hypertension, Pt. on medications Rhythm:regular Rate:Normal     Neuro/Psych negative neurological ROS  negative psych ROS   GI/Hepatic negative GI ROS, Neg liver ROS,   Endo/Other  negative endocrine ROSGestational dm  Renal/GU negative Renal ROS  negative genitourinary   Musculoskeletal   Abdominal   Peds  Hematology negative hematology ROS (+)   Anesthesia Other Findings   Reproductive/Obstetrics negative OB ROS                           Anesthesia Physical Anesthesia Plan  ASA: II  Anesthesia Plan: General   Post-op Pain Management:    Induction: Intravenous  Airway Management Planned: Oral ETT  Additional Equipment:   Intra-op Plan:   Post-operative Plan: Extubation in OR  Informed Consent: I have reviewed the patients History and Physical, chart, labs and discussed the procedure including the risks, benefits and alternatives for the proposed anesthesia with the patient or authorized representative who has indicated his/her understanding and acceptance.   Dental Advisory Given  Plan Discussed with: CRNA and Surgeon  Anesthesia Plan Comments:         Anesthesia Quick Evaluation

## 2012-01-17 ENCOUNTER — Encounter (HOSPITAL_COMMUNITY)
Admission: RE | Disposition: A | Discharge status: Home / Self Care | Payer: Self-pay | Source: Ambulatory Visit | Attending: Gynecologic Oncology

## 2012-01-17 ENCOUNTER — Ambulatory Visit (HOSPITAL_COMMUNITY): Payer: Self-pay | Admitting: Anesthesiology

## 2012-01-17 ENCOUNTER — Encounter (HOSPITAL_COMMUNITY): Payer: Self-pay | Admitting: Anesthesiology

## 2012-01-17 ENCOUNTER — Encounter (HOSPITAL_COMMUNITY): Payer: Self-pay | Admitting: *Deleted

## 2012-01-17 ENCOUNTER — Inpatient Hospital Stay (HOSPITAL_COMMUNITY)
Admission: RE | Admit: 2012-01-17 | Discharge: 2012-01-19 | DRG: 742 | Disposition: A | Discharge status: Home / Self Care | Payer: MEDICAID | Source: Ambulatory Visit | Attending: Obstetrics & Gynecology | Admitting: Obstetrics & Gynecology

## 2012-01-17 DIAGNOSIS — IMO0002 Reserved for concepts with insufficient information to code with codable children: Secondary | ICD-10-CM | POA: Diagnosis not present

## 2012-01-17 DIAGNOSIS — R1900 Intra-abdominal and pelvic swelling, mass and lump, unspecified site: Secondary | ICD-10-CM

## 2012-01-17 DIAGNOSIS — I10 Essential (primary) hypertension: Secondary | ICD-10-CM | POA: Diagnosis present

## 2012-01-17 DIAGNOSIS — N801 Endometriosis of ovary: Principal | ICD-10-CM | POA: Diagnosis present

## 2012-01-17 DIAGNOSIS — D62 Acute posthemorrhagic anemia: Secondary | ICD-10-CM | POA: Diagnosis not present

## 2012-01-17 DIAGNOSIS — Y836 Removal of other organ (partial) (total) as the cause of abnormal reaction of the patient, or of later complication, without mention of misadventure at the time of the procedure: Secondary | ICD-10-CM | POA: Diagnosis not present

## 2012-01-17 DIAGNOSIS — Z79899 Other long term (current) drug therapy: Secondary | ICD-10-CM

## 2012-01-17 DIAGNOSIS — E871 Hypo-osmolality and hyponatremia: Secondary | ICD-10-CM | POA: Diagnosis not present

## 2012-01-17 DIAGNOSIS — Z23 Encounter for immunization: Secondary | ICD-10-CM

## 2012-01-17 DIAGNOSIS — N80109 Endometriosis of ovary, unspecified side, unspecified depth: Principal | ICD-10-CM | POA: Diagnosis present

## 2012-01-17 HISTORY — PX: SALPINGOOPHORECTOMY: SHX82

## 2012-01-17 HISTORY — PX: ABDOMINAL HYSTERECTOMY: SHX81

## 2012-01-17 LAB — PROTIME-INR: INR: 1.25 (ref 0.00–1.49)

## 2012-01-17 LAB — BASIC METABOLIC PANEL
CO2: 22 mEq/L (ref 19–32)
Chloride: 108 mEq/L (ref 96–112)
Glucose, Bld: 148 mg/dL — ABNORMAL HIGH (ref 70–99)
Sodium: 138 mEq/L (ref 135–145)

## 2012-01-17 LAB — POCT I-STAT 4, (NA,K, GLUC, HGB,HCT)
Glucose, Bld: 116 mg/dL — ABNORMAL HIGH (ref 70–99)
HCT: 35 % — ABNORMAL LOW (ref 36.0–46.0)
Hemoglobin: 11.9 g/dL — ABNORMAL LOW (ref 12.0–15.0)
Sodium: 137 mEq/L (ref 135–145)

## 2012-01-17 LAB — CBC
Platelets: 204 10*3/uL (ref 150–400)
RBC: 2.76 MIL/uL — ABNORMAL LOW (ref 3.87–5.11)
WBC: 14.7 10*3/uL — ABNORMAL HIGH (ref 4.0–10.5)

## 2012-01-17 LAB — HEMOGLOBIN AND HEMATOCRIT, BLOOD: Hemoglobin: 8.8 g/dL — ABNORMAL LOW (ref 12.0–15.0)

## 2012-01-17 LAB — APTT: aPTT: 36 seconds (ref 24–37)

## 2012-01-17 SURGERY — ROBOTIC ASSISTED SALPINGO OOPHORECTOMY
Anesthesia: General | Laterality: Left | Wound class: Clean

## 2012-01-17 MED ORDER — OXYCODONE-ACETAMINOPHEN 5-325 MG PO TABS
1.0000 | ORAL_TABLET | ORAL | Status: DC | PRN
  Administered 2012-01-18 (×4): 1 via ORAL
  Administered 2012-01-19 (×3): 2 via ORAL
  Filled 2012-01-17: qty 1
  Filled 2012-01-17 (×3): qty 2
  Filled 2012-01-17 (×2): qty 1

## 2012-01-17 MED ORDER — LACTATED RINGERS IV SOLN
INTRAVENOUS | Status: DC
  Administered 2012-01-17 (×3): via INTRAVENOUS

## 2012-01-17 MED ORDER — SUFENTANIL CITRATE 50 MCG/ML IV SOLN
INTRAVENOUS | Status: DC | PRN
  Administered 2012-01-17 (×4): 5 ug via INTRAVENOUS
  Administered 2012-01-17 (×2): 10 ug via INTRAVENOUS
  Administered 2012-01-17 (×2): 5 ug via INTRAVENOUS

## 2012-01-17 MED ORDER — HYDROMORPHONE HCL PF 1 MG/ML IJ SOLN: 0.2500 mg | INTRAMUSCULAR | Status: DC | PRN

## 2012-01-17 MED ORDER — ACETAMINOPHEN 10 MG/ML IV SOLN
1000.0000 mg | Freq: Four times a day (QID) | INTRAVENOUS | Status: AC
  Administered 2012-01-17 – 2012-01-18 (×3): 1000 mg via INTRAVENOUS
  Filled 2012-01-17 (×4): qty 100

## 2012-01-17 MED ORDER — STERILE WATER FOR IRRIGATION IR SOLN
Status: DC | PRN
  Administered 2012-01-17: 3000 mL

## 2012-01-17 MED ORDER — HYDROMORPHONE 0.3 MG/ML IV SOLN
INTRAVENOUS | Status: DC
  Administered 2012-01-17 (×2): 1.2 mg via INTRAVENOUS
  Administered 2012-01-17: 0.3 mg via INTRAVENOUS
  Administered 2012-01-18: 1.5 mg via INTRAVENOUS
  Administered 2012-01-18: 0.6 mg via INTRAVENOUS
  Administered 2012-01-18: 0.9 mg via INTRAVENOUS

## 2012-01-17 MED ORDER — MIDAZOLAM HCL 5 MG/5ML IJ SOLN
INTRAMUSCULAR | Status: DC | PRN
  Administered 2012-01-17: 2 mg via INTRAVENOUS

## 2012-01-17 MED ORDER — PROPOFOL 10 MG/ML IV BOLUS
INTRAVENOUS | Status: DC | PRN
  Administered 2012-01-17: 200 mg via INTRAVENOUS

## 2012-01-17 MED ORDER — SODIUM CHLORIDE 0.9 % IJ SOLN: 9.0000 mL | INTRAMUSCULAR | Status: DC | PRN

## 2012-01-17 MED ORDER — ONDANSETRON HCL 4 MG/2ML IJ SOLN: 4.0000 mg | Freq: Four times a day (QID) | INTRAMUSCULAR | Status: DC | PRN

## 2012-01-17 MED ORDER — EPHEDRINE SULFATE 50 MG/ML IJ SOLN
INTRAMUSCULAR | Status: DC | PRN
  Administered 2012-01-17: 7.5 mg via INTRAVENOUS

## 2012-01-17 MED ORDER — SODIUM CHLORIDE 0.9 % IR SOLN
Status: DC | PRN
  Administered 2012-01-17: 2000 mL

## 2012-01-17 MED ORDER — PHENYLEPHRINE HCL 10 MG/ML IJ SOLN
INTRAMUSCULAR | Status: DC | PRN
  Administered 2012-01-17: 40 ug via INTRAVENOUS

## 2012-01-17 MED ORDER — HYDROMORPHONE HCL PF 1 MG/ML IJ SOLN
INTRAMUSCULAR | Status: AC
  Filled 2012-01-17: qty 1

## 2012-01-17 MED ORDER — DEXAMETHASONE SODIUM PHOSPHATE 10 MG/ML IJ SOLN
INTRAMUSCULAR | Status: DC | PRN
  Administered 2012-01-17: 5 mg via INTRAVENOUS

## 2012-01-17 MED ORDER — LIDOCAINE HCL (CARDIAC) 20 MG/ML IV SOLN
INTRAVENOUS | Status: DC | PRN
  Administered 2012-01-17: 50 mg via INTRAVENOUS

## 2012-01-17 MED ORDER — CEFOXITIN SODIUM-DEXTROSE 1-4 GM-% IV SOLR (PREMIX)
INTRAVENOUS | Status: AC
  Filled 2012-01-17: qty 100

## 2012-01-17 MED ORDER — ONDANSETRON HCL 4 MG PO TABS: 4.0000 mg | ORAL_TABLET | Freq: Four times a day (QID) | ORAL | Status: DC | PRN

## 2012-01-17 MED ORDER — HYDROMORPHONE HCL PF 1 MG/ML IJ SOLN
INTRAMUSCULAR | Status: DC | PRN
  Administered 2012-01-17 (×2): 0.5 mg via INTRAVENOUS
  Administered 2012-01-17: 1 mg via INTRAVENOUS

## 2012-01-17 MED ORDER — LACTATED RINGERS IR SOLN
Status: DC | PRN
  Administered 2012-01-17: 1000 mL

## 2012-01-17 MED ORDER — HYDROMORPHONE 0.3 MG/ML IV SOLN
INTRAVENOUS | Status: AC
  Administered 2012-01-17: 0.3 mg via INTRAVENOUS
  Filled 2012-01-17: qty 25

## 2012-01-17 MED ORDER — BUPIVACAINE HCL (PF) 0.25 % IJ SOLN
INTRAMUSCULAR | Status: DC | PRN
  Administered 2012-01-17: 8 mL

## 2012-01-17 MED ORDER — DIPHENHYDRAMINE HCL 50 MG/ML IJ SOLN: 12.5000 mg | Freq: Four times a day (QID) | INTRAMUSCULAR | Status: DC | PRN

## 2012-01-17 MED ORDER — DEXTROSE 5 % IV SOLN
2.0000 g | INTRAVENOUS | Status: DC | PRN
  Administered 2012-01-17: 2 g via INTRAVENOUS

## 2012-01-17 MED ORDER — METOCLOPRAMIDE HCL 5 MG/ML IJ SOLN
INTRAMUSCULAR | Status: DC | PRN
  Administered 2012-01-17: 10 mg via INTRAVENOUS

## 2012-01-17 MED ORDER — ONDANSETRON HCL 4 MG/2ML IJ SOLN
INTRAMUSCULAR | Status: DC | PRN
  Administered 2012-01-17: 4 mg via INTRAVENOUS

## 2012-01-17 MED ORDER — DIPHENHYDRAMINE HCL 12.5 MG/5ML PO ELIX: 12.5000 mg | ORAL_SOLUTION | Freq: Four times a day (QID) | ORAL | Status: DC | PRN

## 2012-01-17 MED ORDER — NALOXONE HCL 0.4 MG/ML IJ SOLN: 0.4000 mg | INTRAMUSCULAR | Status: DC | PRN

## 2012-01-17 MED ORDER — ZOLPIDEM TARTRATE 5 MG PO TABS: 5.0000 mg | ORAL_TABLET | Freq: Every evening | ORAL | Status: DC | PRN

## 2012-01-17 MED ORDER — GLYCOPYRROLATE 0.2 MG/ML IJ SOLN
INTRAMUSCULAR | Status: DC | PRN
  Administered 2012-01-17: 0.6 mg via INTRAVENOUS

## 2012-01-17 MED ORDER — ROCURONIUM BROMIDE 100 MG/10ML IV SOLN
INTRAVENOUS | Status: DC | PRN
  Administered 2012-01-17: 50 mg via INTRAVENOUS

## 2012-01-17 MED ORDER — HETASTARCH-ELECTROLYTES 6 % IV SOLN
INTRAVENOUS | Status: DC | PRN
  Administered 2012-01-17 (×2): via INTRAVENOUS

## 2012-01-17 MED ORDER — ACETAMINOPHEN 10 MG/ML IV SOLN
INTRAVENOUS | Status: DC | PRN
  Administered 2012-01-17: 1000 mg via INTRAVENOUS

## 2012-01-17 MED ORDER — KCL IN DEXTROSE-NACL 20-5-0.45 MEQ/L-%-% IV SOLN
INTRAVENOUS | Status: DC
  Administered 2012-01-17: 15:00:00 via INTRAVENOUS
  Filled 2012-01-17 (×4): qty 1000

## 2012-01-17 MED ORDER — CISATRACURIUM BESYLATE (PF) 10 MG/5ML IV SOLN
INTRAVENOUS | Status: DC | PRN
  Administered 2012-01-17 (×2): 2 mg via INTRAVENOUS
  Administered 2012-01-17: 3 mg via INTRAVENOUS

## 2012-01-17 MED ORDER — BUPIVACAINE HCL (PF) 0.25 % IJ SOLN
INTRAMUSCULAR | Status: AC
  Filled 2012-01-17: qty 30

## 2012-01-17 MED ORDER — LACTATED RINGERS IV SOLN: INTRAVENOUS | Status: DC

## 2012-01-17 MED ORDER — HYDROMORPHONE HCL PF 1 MG/ML IJ SOLN
0.2500 mg | INTRAMUSCULAR | Status: DC | PRN
  Administered 2012-01-17: 0.25 mg via INTRAVENOUS
  Administered 2012-01-17: 0.5 mg via INTRAVENOUS
  Administered 2012-01-17: 0.25 mg via INTRAVENOUS

## 2012-01-17 MED ORDER — ACETAMINOPHEN 10 MG/ML IV SOLN
INTRAVENOUS | Status: AC
  Filled 2012-01-17: qty 100

## 2012-01-17 MED ORDER — HEPARIN SODIUM (PORCINE) 1000 UNIT/ML IJ SOLN
INTRAMUSCULAR | Status: AC
  Filled 2012-01-17: qty 1

## 2012-01-17 MED ORDER — NEOSTIGMINE METHYLSULFATE 1 MG/ML IJ SOLN
INTRAMUSCULAR | Status: DC | PRN
  Administered 2012-01-17: 4 mg via INTRAVENOUS

## 2012-01-17 SURGICAL SUPPLY — 59 items
APL ESCP 34 STRL LF DISP (HEMOSTASIS) ×3
APL SKNCLS STERI-STRIP NONHPOA (GAUZE/BANDAGES/DRESSINGS) ×3
APPLICATOR SURGIFLO ENDO (HEMOSTASIS) ×1 IMPLANT
APPLIER CLIP 11 MED OPEN (CLIP) ×4
APR CLP MED 11 20 MLT OPN (CLIP) ×3
BAG SPEC RTRVL LRG 6X4 10 (ENDOMECHANICALS) ×6
BENZOIN TINCTURE PRP APPL 2/3 (GAUZE/BANDAGES/DRESSINGS) ×4 IMPLANT
CHLORAPREP W/TINT 26ML (MISCELLANEOUS) ×4 IMPLANT
CLIP APPLIE 11 MED OPEN (CLIP) IMPLANT
CLOTH BEACON ORANGE TIMEOUT ST (SAFETY) ×4 IMPLANT
CORD HIGH FREQUENCY UNIPOLAR (ELECTROSURGICAL) ×1 IMPLANT
CORDS BIPOLAR (ELECTRODE) ×4 IMPLANT
COVER SURGICAL LIGHT HANDLE (MISCELLANEOUS) ×4 IMPLANT
COVER TIP SHEARS 8 DVNC (MISCELLANEOUS) ×3 IMPLANT
COVER TIP SHEARS 8MM DA VINCI (MISCELLANEOUS) ×1
DECANTER SPIKE VIAL GLASS SM (MISCELLANEOUS) ×3 IMPLANT
DRAPE CAMERA HEAD DAVINCI SI (DRAPES) ×1 IMPLANT
DRAPE LG THREE QUARTER DISP (DRAPES) ×1 IMPLANT
DRAPE SURG IRRIG POUCH 19X23 (DRAPES) ×4 IMPLANT
DRAPE TABLE BACK 44X90 PK DISP (DRAPES) ×2 IMPLANT
DRAPE UTILITY XL STRL (DRAPES) ×4 IMPLANT
DRAPE WARM FLUID 44X44 (DRAPE) ×2 IMPLANT
DRSG TEGADERM 2-3/8X2-3/4 SM (GAUZE/BANDAGES/DRESSINGS) ×1 IMPLANT
DRSG TEGADERM 6X8 (GAUZE/BANDAGES/DRESSINGS) ×8 IMPLANT
DRSG TELFA 4X10 ISLAND STR (GAUZE/BANDAGES/DRESSINGS) ×2 IMPLANT
ELECT REM PT RETURN 9FT ADLT (ELECTROSURGICAL) ×4
ELECTRODE REM PT RTRN 9FT ADLT (ELECTROSURGICAL) ×3 IMPLANT
FLOSEAL 10ML (HEMOSTASIS) ×1 IMPLANT
GAUZE VASELINE 3X9 (GAUZE/BANDAGES/DRESSINGS) IMPLANT
GLOVE BIO SURGEON STRL SZ 6.5 (GLOVE) ×16 IMPLANT
GLOVE BIO SURGEON STRL SZ7.5 (GLOVE) ×8 IMPLANT
GLOVE BIOGEL PI IND STRL 7.0 (GLOVE) ×6 IMPLANT
GLOVE BIOGEL PI INDICATOR 7.0 (GLOVE) ×2
GOWN PREVENTION PLUS XLARGE (GOWN DISPOSABLE) ×20 IMPLANT
HOLDER FOLEY CATH W/STRAP (MISCELLANEOUS) ×3 IMPLANT
KIT ACCESSORY DA VINCI DISP (KITS) ×2
KIT ACCESSORY DVNC DISP (KITS) ×3 IMPLANT
MANIPULATOR UTERINE 4.5 ZUMI (MISCELLANEOUS) ×4 IMPLANT
OCCLUDER COLPOPNEUMO (BALLOONS) ×4 IMPLANT
PENCIL BUTTON HOLSTER BLD 10FT (ELECTRODE) ×3 IMPLANT
POUCH SPECIMEN RETRIEVAL 10MM (ENDOMECHANICALS) ×8 IMPLANT
SET TUBE IRRIG SUCTION NO TIP (IRRIGATION / IRRIGATOR) ×4 IMPLANT
SOLUTION ELECTROLUBE (MISCELLANEOUS) ×4 IMPLANT
SPONGE LAP 18X18 X RAY DECT (DISPOSABLE) ×3 IMPLANT
STRIP CLOSURE SKIN 1/2X4 (GAUZE/BANDAGES/DRESSINGS) ×4 IMPLANT
SUT PDS AB 0 CT1 36 (SUTURE) ×2 IMPLANT
SUT PDS AB 1 CTX 36 (SUTURE) ×1 IMPLANT
SUT VIC AB 0 CT1 27 (SUTURE) ×44
SUT VIC AB 0 CT1 27XBRD ANTBC (SUTURE) ×9 IMPLANT
SUT VIC AB 4-0 PS2 27 (SUTURE) ×8 IMPLANT
SUT VICRYL 0 TIES 12 18 (SUTURE) ×1 IMPLANT
SUT VICRYL 0 UR6 27IN ABS (SUTURE) ×6 IMPLANT
SYR BULB IRRIGATION 50ML (SYRINGE) IMPLANT
TRAP SPECIMEN MUCOUS 40CC (MISCELLANEOUS) ×4 IMPLANT
TROCAR 12M 150ML BLUNT (TROCAR) ×1 IMPLANT
TROCAR BLADELESS OPT 5 100 (ENDOMECHANICALS) ×1 IMPLANT
TROCAR XCEL 12X100 BLDLESS (ENDOMECHANICALS) ×1 IMPLANT
TUBING FILTER THERMOFLATOR (ELECTROSURGICAL) ×3 IMPLANT
WATER STERILE IRR 1500ML POUR (IV SOLUTION) ×6 IMPLANT

## 2012-01-17 NOTE — Interval H&P Note (Signed)
History and Physical Interval Note:  01/17/2012 7:12 AM  Brittany [name]  has presented today for surgery, with the diagnosis of pelvic mass  The various methods of treatment have been discussed with the patient and family. After consideration of risks, benefits and other options for treatment, the patient has consented to  Procedure(s) (LRB) with comments: ROBOTIC ASSISTED SALPINGO OOPHERECTOMY (N/A) BILATERAL SALPINGECTOMY as a surgical intervention .  The patient's history has been reviewed, patient examined, no change in status, stable for surgery.  I have reviewed the patient's chart and labs.  Questions were answered to the patient's satisfaction.     [place], [facility]

## 2012-01-17 NOTE — Anesthesia Postprocedure Evaluation (Signed)
  Anesthesia Post-op Note  Patient: Brittany [name]  Procedure(s) Performed: Procedure(s) (LRB): ROBOTIC ASSISTED SALPINGO OOPHERECTOMY (Left) HYSTERECTOMY ABDOMINAL () SALPINGO OOPHERECTOMY (Bilateral)  Patient Location: PACU  Anesthesia Type: General  Level of Consciousness: awake and alert   Airway and Oxygen Therapy: Patient Spontanous Breathing  Post-op Pain: mild  Post-op Assessment: Post-op Vital signs reviewed, Patient's Cardiovascular Status Stable, Respiratory Function Stable, Patent Airway and No signs of Nausea or vomiting  Post-op Vital Signs: stable  Complications: No apparent anesthesia complications

## 2012-01-17 NOTE — Transfer of Care (Signed)
Immediate Anesthesia Transfer of Care Note  Patient: Brittany [name]  Procedure(s) Performed: Procedure(s) (LRB) with comments: ROBOTIC ASSISTED SALPINGO OOPHERECTOMY (Left) - attempted  HYSTERECTOMY ABDOMINAL () SALPINGO OOPHERECTOMY (Bilateral)  Patient Location: PACU  Anesthesia Type: General  Level of Consciousness: oriented, sedated and patient cooperative  Airway & Oxygen Therapy: Patient Spontanous Breathing and Patient connected to face mask oxygen  Post-op Assessment: Report given to PACU RN, Post -op Vital signs reviewed and stable and Patient moving all extremities  Post vital signs: Reviewed and stable  Complications: No apparent anesthesia complications

## 2012-01-17 NOTE — Op Note (Signed)
Pre-operative Diagnosis: Pelvic mass  Post-operative Diagnosis: Left ovarian endometrioma severe endometriosis  Operation: Total abdominal hysterectomy bilateral salpingo-oophorectomy  Surgeon: Laurette Schimke  Assistant Surgeon: Antionette Char MD  Assistant:  Telford Nab RN, MSN  Anesthesia: GET   Findings: Normal appearing omentum no evidence of metastatic disease to the diaphragmatic surfaces, 5 cm left adnexal mass adherent to the pelvic sidewall and the posterior cul-de-sac and uterus. Evidence of endometriosis within the cul-de-sac and right pelvic sidewall.    Estimated Blood Loss: 1700 cc                Total IV Fluids: Intravenous fluids were administered         Specimens:       Uterus cervix ovaries fallopian tubes        Procedure Details  The patient was seen in the Holding Room. The risks, benefits, complications, treatment options, and expected outcomes were discussed with the patient.  The patient concurred with the proposed plan, giving informed consent.  The site of surgery properly noted/marked. The patient was identified as Education administrator and the procedure verified as left oophorectomy with bilateral salpingectomy. A Time Out was held and the above information confirmed.  After induction of anesthesia, the patient was draped and prepped in the usual sterile manner. Pt was placed in supine position after anesthesia and draped and prepped in the usual sterile manner. The abdominal drape was placed after the CholoraPrep had been allowed to dry for 3 minutes.  Her arms were tucked to her side with all appropriate precautions.  The patient was secured to the be tape applied over the chest wall The patient was placed in the semi-lithotomy position in [place] stirrups.  The perineum was prepped with Betadine.  Foley catheter was placed.  A sterile speculum was placed in the vagina.  The cervix was grasped with a single-tooth tenaculum and dilated with Shawnie Pons  dilators.  The ZUMI uterine manipulator with a medium colpotomizer ring was placed without difficulty.  A pneum occluder balloon was placed over the manipulator.  A second time-out was performed.  OG tube placement was confirmed and to suction.   Next, a 5 mm skin incision was made 1 cm below the subcostal margin in the midclavicular line.  The 5 mm Optiview port and scope was used for direct entry.  Opening pressure was under 10 mm CO2.  The abdomen was insufflated and the findings were noted as above.   At this point and all points during the procedure, the patient's intra-abdominal pressure did not exceed 15 mmHg. Next, a 10 mm skin incision was made about 2 cm above the umbilicus and a right and left port was placed about 10 cm lateral to and 15 degree caudad to the robot port on the right and left side.   All ports were placed under direct visualization.  The patient was placed in steep Trendelenburg.  Bowel was away into the upper abdomen.  The robot was docked in the normal manner.  The left round ligament was incised  the retroperitoneum was entered and the pararectal space was developed.   The peritoneum above the ureter was incised and stretched and the infundibulopelvic ligament was skeletonized, cauterized and cut. The utero-ovarian ligament was cauterized and transected as was the proximal left fallopian tube. Dissection was continued inferiorly however it was challenged by the dense adhesions of the pelvic mass the lower uterine segment.  Attempt was made to dissect the mass in the  posterior attachments to the risks and uterosacral ligaments. Significant bleeding was identified. Cautery was used without much success. Gauze sponges were placed within the pelvis for an attempt to identify the bleeding site. This was noted to have bleeding diffusely involving the lower aspect of the uterus the uterosacral ligaments.  The uterus was noted to be quite boggy and with manipulation lacerations occurred on  the inferior aspect of the uterus.  FloSeal was placed over the bleeding site and attempt to obtain hemostasis. However this was unsuccessful.  Pressure was placed and over the site of the greatest blood loss for approximately 5 minutes. With release of the pressure there was continued collection of blood within the posterior and anterior cul-de-sac. The EBL was assessed and noted to be 1000 cc. Given the extent of the blood loss and the inability to identify the ureter given the endometriosis and the difficulty with uterine manipulation the decision was made to open the patient.   Robotic instruments were removed under direct visulaization. The robot was undocked.  The uterine manipulator and Koh ring were removed.  A Pfannenstiel incision was made and the dissection continued inferiorly into the pelvis. With opening of the peritoneum an additional 100 cc is noted within the cul-de-sac.  A Bookwalter retractor was placed and visualization obtained. Extensive endometriosis was noted on the contralateral ovary. Bleeding was noted to be diffusely involving the posterior uterus and this time it was determined a hysterectomy would be necessary. Right round ligament was transected and the anterior peritoneum was also taken down.  The bladder flap was created the uterine artery on the right side was skeletonized, cauterized and cut in the normal manner.  A similar procedure was performed on the left. Clamps were placed over the paracervical and cardinal ligaments and secured with Vicryl suture. This was continued just inferior to the cervix. The colpotomy was made and the uterus, cervix, bilateral ovaries and tubes were amputated and delivered through the vagina.  Pedicles were inspected and excellent hemostasis was achieved.    Frozen section of the left adnexa returned consistent with endometrioma.  The area was copiously irrigated and drained. The vaginal cuff was closed with 0 PDS sutures in the standard  Berman fashion. Hemostasis was once again achieved.  The area was noted to be slightly oozy and FloSeal was placed with excellent hemostasis.  The laps and instruments were removed. Rectus muscles were approximated with interrupted 0 Vicryl sutures. The fascia was closed with 0 PDS suture.  The subcutaneous tissue of all the surgical site was copiously irrigated and drained. The skin was closed with 4-0 Vicryl suture.  The 10 mm ports were closed with Vicryl on a UR-5 needle and the fascia was closed with 0 Vicryl on a UR-5 needle.  The skin was closed over the port site incision with 4-0 Vicryl in a subcuticular manner.  Steri-Strips were applied and bandages were also applied.  Sponge, lap and needle counts correct x 2.  The patient was taken to the recovery room in stable condition.  The vagina was swabbed with  minimal bleeding noted.   All instrument and needle counts were correct x  3.   Specimens:  Uterus, cervix, bilateral ovaries, bilateral tubes.  The patient was transferred to the recovery room in stable condition.  [place], [facility]

## 2012-01-17 NOTE — H&P (View-Only) (Signed)
Office Visit: Gyn-Onc  CC:  Chief Complaint  Patient presents with  . Ovarian Cyst    Follow up    HPI: 36 y/o G3P3 Patient's last menstrual period was 11/20/2011. presented to the ER 11/22/2011 for abdominal pain in the lower abdomen and periumblilical area for one day. Patient denies nausea or vomiting,  Reported fevers and nervousness at the time of presentation.  No diarrhea or constipation. WBC was 17K Patient admitted overnight and discharged. Denies vaginal discharge at the time of persentation, GC and chlymidia subsequently negative.    A CT of the abdomen and pelvis was notable for a 3.5 cm cyst on the left ovary with an oblong 2.1 x 1.0 cm cyst also in the left ovary. There is a 2.4 cm cyst on the right ovary. There is edema and increased vascularity in the adjacent omentum particularly slightly to the right. There is no focal nodularity.    Interval History: Patient without pain since, denies any further febrile episodes.  Repeat CA 125 returned 14.3.  white blood cell count 7.6K.  Repeat UTZ persistent 4.1 cm complex cyst within the left ovary with layering debris level. Differential considerations would include hemorrhagic cyst, endometrioma or tubo-ovarian abscess. Less likely cystic ovarian neoplasm, but this cannot be completely excluded.  Current Meds:  Outpatient Encounter Prescriptions as of 12/20/2011  Medication Sig Dispense Refill  . hydrochlorothiazide (MICROZIDE) 12.5 MG capsule Take 12.5 mg by mouth daily.        Allergy: No Known Allergies  Social Hx:   History   Social History  . Marital Status: Married    Spouse Name: N/A    Number of Children: N/A  . Years of Education: N/A   Occupational History  . Not on file.   Social History Main Topics  . Smoking status: Never Smoker   . Smokeless tobacco: Not on file  . Alcohol Use: No  . Drug Use: No  . Sexually Active:    Other Topics Concern  . Not on file   Social History Narrative  . No narrative on  file    Past Surgical Hx: No past surgical history on file.  Past Medical Hx:  Past Medical History  Diagnosis Date  . Hypertension     Past Gynecological History: G3P3 NSVD x 3.  Menarche 14 every 28 days.  Uses condoms.  Used OCP and Depo with significant weight gain. Last pap 2012 wnl.   Family Hx: No family history on file.  Review of Systems:  Constitutional  Feels well,Cardiovascular  No chest pain, shortness of breath, or edema  Pulmonary  No cough or wheeze.  Gastro Intestinal  No nausea, vomitting, or diarrhoea. No bright red blood per rectum, no abdominal pain, change in bowel movement, or constipation.  Genito Urinary  No frequency, urgency, dysuria, no vaginal bleeding or discharge Musculo Skeletal  No myalgia, arthralgia, joint swelling or pain  Neurologic  No weakness, numbness, change in gait,  Psychology  No depression, anxiety, insomnia.   Vitals:  Blood pressure 138/78, pulse 84, temperature 98 F (36.7 C), temperature source Oral, resp. rate 16, height 5' 3.19" (1.605 m), weight 206 lb 1.6 oz (93.486 kg), last menstrual period 11/20/2011.  Physical Exam: WD in NAD Neck  Supple NROM, without any enlargements.  Lymph Node Survey No cervical supraclavicular or inguinal adenopathy Cardiovascular  Pulse normal rate, regularity and rhythm.  Lungs  Clear to auscultation bilateraly, without wheezes/crackles/rhonchi. Psychiatry  Alert and oriented to person, place, and  time  Abdomen  Normoactive bowel sounds, abdomen soft, non-tender and obese.   Back No CVA tenderness Genito Urinary  Vulva/vagina: Normal external female genitalia.  No lesions. No discharge or bleeding.  Bladder/urethra:  No lesions or masses  Vagina: well estrogenized, no discharge or bleeding, no CMT  Cervix: Normal appearing, no lesions.  Uterus: Small, mobile, no parametrial involvement or nodularity.  Adnexa: No palpable masses, no cul de sac nodularity. Rectal  Good tone, no  masses no cul de sac nodularity.  Extremities  No bilateral cyanosis, clubbing or edema.   Assessment/Plan:  Ms. Brittany [name]  is a 36 y.o.  year old with a CT scan on day 2 of her cycle notable to two oblong cystic ovarian structures and omental edema and avscularity.  WBC at the time of presentation was 17K ad CA 125 elevated to 64.  Repeat CA 125 and WBC are within normal limits. Repeat transvaginal ultrasound demonstrates a presence of a persistent left adnexal mass. The differential diagnosis is an endometrioma versus tubo-ovarian abscess.  Options presented to the patient were that of repeat ultrasound and intervention in 2 months versus, unilateral salpingo-oophorectomy. The pain was so significant that the patient at this time is very apprehensive of that repeat the discomfort and opts for minimally invasive removal of the left ovary and tube.  The risks and benefits of the procedure were discussed with the patient and the risks discussed were inclusive of infection bleeding damage to surrounding structures prolonged hospitalization and reoperation.  The procedure will likely occur on 01/17/2012  Laurette Schimke, MD, PhD 12/20/2011, 5:09 PM

## 2012-01-18 ENCOUNTER — Encounter (HOSPITAL_COMMUNITY): Payer: Self-pay | Admitting: Gynecologic Oncology

## 2012-01-18 DIAGNOSIS — N801 Endometriosis of ovary: Secondary | ICD-10-CM | POA: Diagnosis present

## 2012-01-18 LAB — CBC
MCH: 32.8 pg (ref 26.0–34.0)
MCV: 94.5 fL (ref 78.0–100.0)
Platelets: 195 10*3/uL (ref 150–400)
RBC: 2.56 MIL/uL — ABNORMAL LOW (ref 3.87–5.11)
RDW: 13.3 % (ref 11.5–15.5)

## 2012-01-18 LAB — BASIC METABOLIC PANEL
BUN: 6 mg/dL (ref 6–23)
Chloride: 102 mEq/L (ref 96–112)
Creatinine, Ser: 0.49 mg/dL — ABNORMAL LOW (ref 0.50–1.10)
GFR calc Af Amer: >90 mL/min (ref >90)
GFR calc non Af Amer: >90 mL/min (ref >90)
Potassium: 4 mEq/L (ref 3.5–5.1)

## 2012-01-18 MED ORDER — INFLUENZA VIRUS VACC SPLIT PF IM SUSP
0.5000 mL | Freq: Once | INTRAMUSCULAR | Status: AC
  Administered 2012-01-18: 0.5 mL via INTRAMUSCULAR
  Filled 2012-01-18: qty 0.5

## 2012-01-18 NOTE — Progress Notes (Signed)
1 Day Post-Op Procedure(s) (LRB): ROBOTIC ASSISTED SALPINGO OOPHERECTOMY (Left) HYSTERECTOMY ABDOMINAL () SALPINGO OOPHERECTOMY (Bilateral)  Subjective: Patient reports no complaints.  Tolerating PO intake.  Reporting minimal pain.  Denies passing flatus or having a bowel movement post operatively.   Objective: Vital signs in last 24 hours: Temp:  [97 F (36.1 C)-98.1 F (36.7 C)] 98 F (36.7 C) (09/11 0600) Pulse Rate:  [72-100] 83  (09/11 0600) Resp:  [11-20] 16  (09/11 0800) BP: (111-143)/(60-72) 122/72 mmHg (09/11 0600) SpO2:  [95 %-100 %] 100 % (09/11 0800) Weight:  [207 lb (93.895 kg)] 207 lb (93.895 kg) (09/10 1339)   Intake/Output from previous day: 09/10 0701 - 09/11 0700 In: 5278.3 [P.O.:120; I.V.:3758.3; IV Piggyback:1400] Out: 4840 [Urine:3140; Blood:1700]  Physical Examination: General: alert, cooperative and no distress Resp: clear to auscultation bilaterally Cardio: regular rate and rhythm, S1, S2 normal, no murmur, click, rub or gallop GI: soft, non-tender; bowel sounds normal; no masses,  no organomegaly and incision: clean, dry and intact Extremities: extremities normal, atraumatic, no cyanosis or edema  Labs: WBC/Hgb/Hct/Plts:  9.5/8.4/24.2/195 (09/11 0810) BUN/Cr/glu/ALT/AST/amyl/lip:  6/0.49/--/--/--/--/-- (09/11 0345)  Assessment: 36 y.o. s/p Procedure(s): ROBOTIC ASSISTED SALPINGO OOPHERECTOMY HYSTERECTOMY ABDOMINAL SALPINGO OOPHERECTOMY: stable Pain:  Pain is well-controlled on PCA.  Heme:  Anemia post-operatively, Hgb 8.4 and Hct 24.2 this am.  Pre-op Hgb 13.7 and Hct 40.1.  Pt asymptomatic.    GI:  Tolerating po: Yes     FEN: Mild hyponatremia post-operatively.  Na+ 132 this am, pre-op 135.  Hypocalcemia: Ca+ 7.9 this am, pre-op 9.4.  Plan to monitor.    Prophylaxis: intermittent pneumatic compression boots.  Plan: Advance diet Encourage ambulation Advance to PO medication Discontinue IV fluids K-pad to assist with pain management CBC  and Bmet in the am  Dispense handouts in spanish on endometriosis   LOS: 1 day    CROSS, MELISSA DEAL 01/18/2012, 9:37 AM

## 2012-01-18 NOTE — Care Management Note (Signed)
    Page 1 of 1   01/19/2012     12:16:59 PM   CARE MANAGEMENT NOTE 01/19/2012  Patient:  [facility]   Account Number:  [id number]  Date Initiated:  01/18/2012  Documentation initiated by:  Lorenda Ishihara  Subjective/Objective Assessment:   36 yo female admitted s/p open TAH. PTA lived at home with spouse.     Action/Plan:   Anticipated DC Date:  01/21/2012   Anticipated DC Plan:  HOME/SELF CARE      DC Planning Services  CM consult      Choice offered to / List presented to:             Status of service:  Completed, signed off Medicare Important Message given?   (If response is "NO", the following Medicare IM given date fields will be blank) Date Medicare IM given:   Date Additional Medicare IM given:    Discharge Disposition:  HOME/SELF CARE  Per UR Regulation:  Reviewed for med. necessity/level of care/duration of stay  If discussed at Long Length of Stay Meetings, dates discussed:    Comments:

## 2012-01-18 NOTE — Progress Notes (Signed)
Pt has had URO of 1550 total between 2059-0300. [phone] number called, and referred nurse to [phone] number which then referred nurse to [phone] to a [facility] hospital answering service. The on-call MD was notified of the Pt's output volume, and the On call said that was ok.

## 2012-01-19 DIAGNOSIS — D62 Acute posthemorrhagic anemia: Secondary | ICD-10-CM | POA: Diagnosis not present

## 2012-01-19 LAB — BASIC METABOLIC PANEL
BUN: 6 mg/dL (ref 6–23)
Chloride: 102 mEq/L (ref 96–112)
GFR calc Af Amer: >90 mL/min (ref >90)
GFR calc non Af Amer: >90 mL/min (ref >90)
Potassium: 3.6 mEq/L (ref 3.5–5.1)
Sodium: 135 mEq/L (ref 135–145)

## 2012-01-19 LAB — CBC
MCHC: 34.3 g/dL (ref 30.0–36.0)
Platelets: 214 10*3/uL (ref 150–400)
RDW: 13.4 % (ref 11.5–15.5)
WBC: 8.6 10*3/uL (ref 4.0–10.5)

## 2012-01-19 MED ORDER — OXYCODONE-ACETAMINOPHEN 5-325 MG PO TABS: 1.0000 | ORAL_TABLET | Freq: Four times a day (QID) | ORAL | Status: AC | PRN

## 2012-01-19 MED ORDER — FERROUS SULFATE 325 (65 FE) MG PO TABS: 325.0000 mg | ORAL_TABLET | Freq: Two times a day (BID) | ORAL | Status: AC

## 2012-01-19 MED ORDER — MEGESTROL ACETATE 20 MG PO TABS: 20.0000 mg | ORAL_TABLET | Freq: Every day | ORAL | Status: AC

## 2012-01-19 NOTE — Progress Notes (Signed)
Discharge instruction given with spanish interperter  [phone].  Patient and family verbalize understanding and perform repeat back.  Nursing student [facility], followed up with questions.  Discharge papers and prescription given per md order.  Patient aware needs to call office for follow up appointment and prescriptions need to be picked up from [facility] [address].

## 2012-01-19 NOTE — Discharge Summary (Signed)
Physician Discharge Summary  Patient ID: Brittany [name] MRN: [id number] DOB/AGE: 1976/04/05 36 y.o.  Admit date: 01/17/2012 Discharge date: 01/19/2012  Admission Diagnoses: Endometrioma of ovary  Discharge Diagnoses:  Principal Problem:  *Endometrioma of ovary Active Problems:  Postoperative anemia due to acute blood loss   Discharged Condition: good  Hospital Course: On 01/17/2012, the patient underwent the following: Procedure(s): ROBOTIC ASSISTED SALPINGO OOPHERECTOMY HYSTERECTOMY ABDOMINAL SALPINGO OOPHERECTOMY.   The procedure was complicated by an intraoperative hemorrhage.  She remained hemodynamically stable.  On POD # 2, a Hgb was 8.5.   The patient was asymptomatic.  She was discharged to home on postoperative day 2 tolerating a regular diet.  Consults: None  Significant Diagnostic Studies: none  Treatments: surgery: see above  Discharge Exam: Blood pressure 122/56, pulse 96, temperature 98 F (36.7 C), temperature source Oral, resp. rate 18, height 5\' 3"  (1.6 m), weight 93.895 kg (207 lb), SpO2 98.00%. General appearance: alert GI: soft, non-tender; bowel sounds normal; no masses,  no organomegaly Extremities: extremities normal, atraumatic, no cyanosis or edema Incision/Wound:  C/D/I  Disposition: 01-Home or Self Care  Discharge Orders    Future Orders Please Complete By Expires   Diet - low sodium heart healthy      Increase activity slowly      May walk up steps      May shower / Bathe      Comments:   No tub baths for 6 weeks   Driving Restrictions      Comments:   No driving for 1 weeks   Lifting restrictions      Comments:   No lifting > 5 lbs for 6 weeks   Sexual Activity Restrictions      Comments:   No intercourse for 6  weeks   Discharge wound care:      Comments:   Keep clean and dry   Call MD for:  temperature >100.4      Call MD for:  persistant nausea and vomiting      Call MD for:  severe uncontrolled pain      Call MD  for:  redness, tenderness, or signs of infection (pain, swelling, redness, odor or green/yellow discharge around incision site)      Call MD for:  persistant dizziness or light-headedness      Call MD for:  extreme fatigue          Medication List     As of 01/19/2012  9:00 AM    STOP taking these medications         acetaminophen 500 MG tablet   Commonly known as: TYLENOL      TAKE these medications         EQL MEGA SELECT WOMENS PO   Take 1 tablet by mouth daily.      ferrous sulfate 325 (65 FE) MG tablet   Take 1 tablet (325 mg total) by mouth 2 (two) times daily before a meal.      fish oil-omega-3 fatty acids 1000 MG capsule   Take 1 g by mouth daily.      ibuprofen 200 MG tablet   Commonly known as: ADVIL,MOTRIN   Take 200 mg by mouth every 6 (six) hours as needed.      megestrol 20 MG tablet   Commonly known as: MEGACE   Take 1 tablet (20 mg total) by mouth daily.      oxyCODONE-acetaminophen 5-325 MG per tablet   Commonly known as: PERCOCET/ROXICET  Take 1-2 tablets by mouth every 6 (six) hours as needed (moderate to severe pain (when tolerating fluids)).      phentermine 37.5 MG capsule   Take 37.5 mg by mouth daily.         SignedAntionette Char A 01/19/2012, 9:00 AM

## 2012-01-21 LAB — TYPE AND SCREEN
ABO/RH(D): O POS
Antibody Screen: NEGATIVE
Unit division: 0
Unit division: 0

## 2012-02-28 ENCOUNTER — Ambulatory Visit: Payer: Self-pay | Attending: Gynecologic Oncology | Admitting: Gynecologic Oncology

## 2012-02-28 ENCOUNTER — Encounter: Payer: Self-pay | Admitting: Gynecologic Oncology

## 2012-02-28 VITALS — BP 118/64 | HR 72 | Temp 98.7°F | Resp 20 | Ht 62.64 in | Wt 209.8 lb

## 2012-02-28 DIAGNOSIS — N83209 Unspecified ovarian cyst, unspecified side: Secondary | ICD-10-CM | POA: Insufficient documentation

## 2012-02-28 DIAGNOSIS — Z90710 Acquired absence of both cervix and uterus: Secondary | ICD-10-CM | POA: Insufficient documentation

## 2012-02-28 DIAGNOSIS — I10 Essential (primary) hypertension: Secondary | ICD-10-CM | POA: Insufficient documentation

## 2012-02-28 DIAGNOSIS — Z9079 Acquired absence of other genital organ(s): Secondary | ICD-10-CM | POA: Insufficient documentation

## 2012-02-28 NOTE — Patient Instructions (Addendum)
Increase weightbearing exercises, physical exertion and to decrease oral intake and to supplement her diet with calcium.  F/U with Dr. Gaynell Face in 9 months.  Thank you very much Ms. Marne Saldana-garcia for allowing me to provide care for you today.  I appreciate your confidence in choosing our Gynecologic Oncology team.  If you have any questions about your visit today please call our office and we will get back to you as soon as possible.  Maryclare Labrador. Colt Martelle MD., PhD Gynecologic Oncology

## 2012-02-28 NOTE — Progress Notes (Signed)
Office Visit: Gyn-Onc  CC:  Chief Complaint  Patient presents with  . Ovarian Cyst    Follow up    HPI: 36 y/o G3P3 Patient's last menstrual period was 01/08/2012. presented to the ER 11/22/2011 for abdominal pain in the lower abdomen and periumblilical area for one day. Patient denies nausea or vomiting,  Reported fevers and nervousness at the time of presentation.  No diarrhea or constipation. WBC was 17K Patient admitted overnight and discharged. Denies vaginal discharge at the time of persentation, GC and chlymidia subsequently negative.    A CT of the abdomen and pelvis was notable for a 3.5 cm cyst on the left ovary with an oblong 2.1 x 1.0 cm cyst also in the left ovary. There is a 2.4 cm cyst on the right ovary. There is edema and increased vascularity in the adjacent omentum particularly slightly to the right. There is no focal nodularity.    Interval History:  Repeat CA 125 returned 14.3.  white blood cell count 7.6K.  Repeat UTZ persistent 4.1 cm complex cyst within the left ovary with layering debris level. Differential considerations would include hemorrhagic cyst, endometrioma or tubo-ovarian abscess. Less likely cystic ovarian neoplasm.  On January 17 2012 she underwent a total abdominal hysterectomy bilateral salpingo-oophorectomy. I. laparotomy was performed for control of intraoperative bleeding. Final pathology  1. Ovary and fallopian tube, left - ENDOMETRIOTIC CYST. - NO EVIDENCE OF MALIGNANCY. - UNREMARKABLE FALLOPIAN TUBE. 2. Uterus and cervix, right tube and ovary - CERVIX: NABOTHIAN CYST. - ENDOMETRIUM: PROLIFERATION. NO EVIDENCE OF HYPERPLASIA OR CARCINOMA. - MYOMETRIUM: UNREMARKABLE. - UTERINE SEROSA: FIBROUS ADHESIONS AND FOCAL ENDOMETRIOSIS. NO EVIDENCE OF MALIGNANCY. - RIGHT OVARY: ENDOMETRIOTIC CYST AND FIBROUS ADHESIONS. NO EVIDENCE OF MALIGNANCY. - RIGHT FALLOPIAN TUBE: UNREMARKABLE.  At this visit the patient reports hot flashes and emotional lability.  She states her hot flashes are worse at night and awaken her from sleep.  Current Meds:  Outpatient Encounter Prescriptions as of 02/28/2012  Medication Sig Dispense Refill  . ferrous sulfate 325 (65 FE) MG tablet Take 1 tablet (325 mg total) by mouth 2 (two) times daily before a meal.  60 tablet  11  . fish oil-omega-3 fatty acids 1000 MG capsule Take 1 g by mouth daily.      Marland Kitchen ibuprofen (ADVIL,MOTRIN) 200 MG tablet Take 200 mg by mouth every 6 (six) hours as needed.      . Multiple Vitamins-Minerals (EQL MEGA SELECT WOMENS PO) Take 1 tablet by mouth daily.      . phentermine 37.5 MG capsule Take 37.5 mg by mouth daily.        Allergy: No Known Allergies  Social Hx:   History   Social History  . Marital Status: Married    Spouse Name: N/A    Number of Children: N/A  . Years of Education: N/A   Occupational History  . Not on file.   Social History Main Topics  . Smoking status: Never Smoker   . Smokeless tobacco: Not on file  . Alcohol Use: No  . Drug Use: No  . Sexually Active: No   Other Topics Concern  . Not on file   Social History Narrative  . No narrative on file    Past Surgical Hx:  Past Surgical History  Procedure Date  . Abdominal hysterectomy 01/17/2012    Procedure: HYSTERECTOMY ABDOMINAL;  Surgeon: Laurette Schimke, MD PHD;  Location: [facility];  Service: Gynecology;;  . Salpingoophorectomy 01/17/2012    Procedure: SALPINGO  OOPHERECTOMY;  Surgeon: Laurette Schimke, MD PHD;  Location: [facility];  Service: Gynecology;  Laterality: Bilateral;    Past Medical Hx:  Past Medical History  Diagnosis Date  . Pelvic mass   . Hypertension     NO MEDS CURRENTLY  . Constipation   . Gestational diabetes     Past Gynecological History: G3P3 NSVD x 3.  Menarche 14 every 28 days.  Uses condoms.  Used OCP and Depo with significant weight gain. Last pap 2012 wnl.   Family Hx: No family history on file.  Review of Systems:  Constitutional  Feels well,Cardiovascular No  chest pain, shortness of breath, or edema  Pulmonary  No cough or wheeze.  Gastro Intestinal  No nausea, vomitting, or diarrhoea. No bright red blood per rectum, no abdominal pain, change in bowel movement, or constipation.  Genito Urinary  No vaginal bleeding or discharge.  Reports hot flashes, mood lability and disrupted sleep.  Vitals:  Blood pressure 118/64, pulse 72, temperature 98.7 F (37.1 C), temperature source Oral, resp. rate 20, height 5' 2.64" (1.591 m), weight 209 lb 12.8 oz (95.165 kg), last menstrual period 01/08/2012.  Physical Exam: WD in NAD Cardiovascular  Pulse normal rate, regularity and rhythm.  Lungs  Clear to auscultation bilateraly, without wheezes/crackles/rhonchi. Psychiatry  Alert and oriented to person, place, and time  Abdomen  Normoactive bowel sounds, abdomen soft, non-tender and obese.  Surgical sites without any evidence of hernia Back No CVA tenderness Genito Urinary  Vulva/vagina: Cuff intact.  vagina: well estrogenized, no bleeding or discharge.  Extremities  No bilateral cyanosis, clubbing or edema.   Assessment/Plan:  Ms. Brittany [name]  is a 36 y.o.  year old status post total abdominal hysterectomy bilateral salpingo-oophorectomy for endometriosis. At this point she has menopausal symptoms. She was counseled regarding hormonal therapy.  The risks and benefits of estrogen therapy and menopause were discussed with the patient and her husband.  The patient's been advised to increase weightbearing exercises, physical exertion and to decrease oral intake and to supplement her diet with calcium.  F/U with Dr. Gaynell Face in 9 months.   Laurette Schimke, MD, PhD 02/28/2012, 10:07 AM

## 2013-08-01 ENCOUNTER — Emergency Department (HOSPITAL_COMMUNITY): Payer: Self-pay

## 2013-08-01 ENCOUNTER — Emergency Department (HOSPITAL_COMMUNITY)
Admission: EM | Admit: 2013-08-01 | Discharge: 2013-08-01 | Disposition: A | Discharge status: Home / Self Care | Payer: Self-pay | Attending: Emergency Medicine | Admitting: Emergency Medicine

## 2013-08-01 ENCOUNTER — Encounter (HOSPITAL_COMMUNITY): Payer: Self-pay | Admitting: Emergency Medicine

## 2013-08-01 DIAGNOSIS — R296 Repeated falls: Secondary | ICD-10-CM | POA: Insufficient documentation

## 2013-08-01 DIAGNOSIS — Y9389 Activity, other specified: Secondary | ICD-10-CM | POA: Insufficient documentation

## 2013-08-01 DIAGNOSIS — I10 Essential (primary) hypertension: Secondary | ICD-10-CM | POA: Insufficient documentation

## 2013-08-01 DIAGNOSIS — F411 Generalized anxiety disorder: Secondary | ICD-10-CM | POA: Insufficient documentation

## 2013-08-01 DIAGNOSIS — Y92009 Unspecified place in unspecified non-institutional (private) residence as the place of occurrence of the external cause: Secondary | ICD-10-CM | POA: Insufficient documentation

## 2013-08-01 DIAGNOSIS — F329 Major depressive disorder, single episode, unspecified: Secondary | ICD-10-CM | POA: Insufficient documentation

## 2013-08-01 DIAGNOSIS — Z8632 Personal history of gestational diabetes: Secondary | ICD-10-CM | POA: Insufficient documentation

## 2013-08-01 DIAGNOSIS — F3289 Other specified depressive episodes: Secondary | ICD-10-CM | POA: Insufficient documentation

## 2013-08-01 DIAGNOSIS — Z8719 Personal history of other diseases of the digestive system: Secondary | ICD-10-CM | POA: Insufficient documentation

## 2013-08-01 DIAGNOSIS — S99929A Unspecified injury of unspecified foot, initial encounter: Secondary | ICD-10-CM

## 2013-08-01 DIAGNOSIS — S8990XA Unspecified injury of unspecified lower leg, initial encounter: Secondary | ICD-10-CM | POA: Insufficient documentation

## 2013-08-01 DIAGNOSIS — Z79899 Other long term (current) drug therapy: Secondary | ICD-10-CM | POA: Insufficient documentation

## 2013-08-01 DIAGNOSIS — R55 Syncope and collapse: Secondary | ICD-10-CM | POA: Insufficient documentation

## 2013-08-01 DIAGNOSIS — S99919A Unspecified injury of unspecified ankle, initial encounter: Secondary | ICD-10-CM

## 2013-08-01 DIAGNOSIS — Z8742 Personal history of other diseases of the female genital tract: Secondary | ICD-10-CM | POA: Insufficient documentation

## 2013-08-01 DIAGNOSIS — R51 Headache: Secondary | ICD-10-CM | POA: Insufficient documentation

## 2013-08-01 HISTORY — DX: Depression, unspecified: F32.A

## 2013-08-01 HISTORY — DX: Major depressive disorder, single episode, unspecified: F32.9

## 2013-08-01 LAB — BASIC METABOLIC PANEL
BUN: 10 mg/dL (ref 6–23)
CO2: 25 mEq/L (ref 19–32)
Calcium: 10.5 mg/dL (ref 8.4–10.5)
Chloride: 105 mEq/L (ref 96–112)
Creatinine, Ser: 0.5 mg/dL (ref 0.50–1.10)
Glucose, Bld: 105 mg/dL — ABNORMAL HIGH (ref 70–99)
POTASSIUM: 4 meq/L (ref 3.7–5.3)
SODIUM: 142 meq/L (ref 137–147)

## 2013-08-01 LAB — CBC
HCT: 42.8 % (ref 36.0–46.0)
Hemoglobin: 14.6 g/dL (ref 12.0–15.0)
MCH: 32.4 pg (ref 26.0–34.0)
MCHC: 34.1 g/dL (ref 30.0–36.0)
MCV: 94.9 fL (ref 78.0–100.0)
PLATELETS: 216 10*3/uL (ref 150–400)
RBC: 4.51 MIL/uL (ref 3.87–5.11)
RDW: 13.1 % (ref 11.5–15.5)
WBC: 7 10*3/uL (ref 4.0–10.5)
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
## 2013-08-01 NOTE — ED Notes (Addendum)
Pt not in room when I returned with translator phone. Currently in xray

## 2013-08-01 NOTE — Progress Notes (Signed)
[facility] provided pt with a [facility] Orange Card application, highlighting [facility], to help patient establish primary care.  °

## 2013-08-01 NOTE — ED Notes (Signed)
Patient transported to CT 

## 2013-08-01 NOTE — Discharge Instructions (Signed)
°Emergency Department Resource Guide °1) Find a Doctor and Pay Out of Pocket °Although you won't have to find out who is covered by your insurance plan, it is a good idea to ask around and get recommendations. You will then need to call the office and see if the doctor you have chosen will accept you as a new patient and what types of options they offer for patients who are self-pay. Some doctors offer discounts or will set up payment plans for their patients who do not have insurance, but you will need to ask so you aren't surprised when you get to your appointment. ° °2) Contact Your Local Health Department °Not all health departments have doctors that can see patients for sick visits, but many do, so it is worth a call to see if yours does. If you don't know where your local health department is, you can check in your phone book. The CDC also has a tool to help you locate your state's health department, and many state websites also have listings of all of their local health departments. ° °3) Find a Walk-in Clinic °If your illness is not likely to be very severe or complicated, you may want to try a walk in clinic. These are popping up all over the country in pharmacies, drugstores, and shopping centers. They're usually staffed by nurse practitioners or physician assistants that have been trained to treat common illnesses and complaints. They're usually fairly quick and inexpensive. However, if you have serious medical issues or chronic medical problems, these are probably not your best option. ° °No Primary Care Doctor: °- Call Health Connect at  832-8000 - they can help you locate a primary care doctor that  accepts your insurance, provides certain services, etc. °- Physician Referral Service- 1-800-533-3463 ° °Chronic Pain Problems: °Organization         Address  Phone   Notes  °[facility]  (336) 297-2271 Patients need to be referred by their primary care doctor.  ° °Medication  Assistance: °Organization         Address  Phone   Notes  °[place] [facility] [address] °[place], [place] [address] (336) 641-8030 --Must be a resident of [place] °-- Must have NO insurance coverage whatsoever (no Medicaid/ Medicare, etc.) °-- The pt. MUST have a primary care doctor that directs their care regularly and follows them in the community °  °[facility]  (866) 331-1348   °[employer]  (888) 892-1162   ° °Agencies that provide inexpensive medical care: °Organization         Address  Phone   Notes  °[facility]  (336) 832-8035   °[facility]    (336) 832-7272   °[facility] [address] °[place], [place] [address] (336) 832-4777   °[facility] [address], °[place] (336) 271-4999   °[facility]    (336) 373-0678   °[facility]    (336) 272-1050   °[facility] ° [address], [place] Phone:  (336) 832-4444, Fax:  (336) 832-4440 Hours of Operation:  9 am - 6 pm, M-F.  Also accepts Medicaid/Medicare and self-pay.  °[facility] ° [address], [place] Phone: (336) 832-3150, Fax: (336) [phone]. Hours of Operation:  8:30 am - 5:30 pm, M-F.  Also accepts Medicaid and self-pay.  °[facility] [place] [address], [place] Phone: (336) 878-6027   °[facility] [address], [place], [place] (336)723-1848, Ext. 123 Mondays & Thursdays: 7-9 AM.  First 15 patients are seen on a first come, first serve basis. °  ° °Medicaid-accepting [place] Providers: ° °Organization         Address  Phone   Notes  °[facility] [address], [place] (336) 641-2100 Also accepts self-pay patients.  °[facility] [address], [place] ° (336) 856-9996   °[facility] [address], [place]  (336) 288-8857   °[facility] [address] [place] [address], [place] (336) 299-7000   °Veita Bland [address], [place]  ° (336) 373-1557 Only accepts [place] Access Medicaid patients after they have their name applied to their card.  ° °Self-Pay (no insurance) in [place]: ° °Organization         Address  Phone   Notes  °Sickle Cell Patients, [facility] [address], [place] (336) 832-1970   °[facility] [address], [place] (336) 832-4400   ° [facility] ° [address], [place] (336) 992-4800   °[facility]/Dr. Osei-Bonsu ° [address] [place] [address], [place] or [address], [place] (336) 841-8500 Phone number for both [place] and [place] locations is the same.  °[facility] [address], [place] (336) 299-0000   °[facility] [address] [place] [address], [place] or [address] (336) 852-7530 °(336) 878-2260   °[facility] [address], [place] (336) 350-1642, phone; (336) 294-5005, fax Sees patients 1st and 3rd Saturday of every month.  Must not qualify for public or private insurance (i.e. Medicaid, Medicare, [place] Health Choice, Veterans' Benefits) • Household income should be no more than 200% of the poverty level •The clinic cannot treat you if you are pregnant or think you are pregnant • Sexually transmitted diseases are not treated at the clinic.  ° ° °Dental Care: °Organization         Address  Phone  Notes  °[place] [facility] [address], [place] (336) 641-6152 Accepts children up to age 21 who are enrolled in Medicaid or [place] Health Choice; pregnant women with a Medicaid card; and children who have applied for Medicaid or [place] Health Choice, but were declined, whose parents can pay a reduced fee at time of service.  °[place]  [facility] [place]  [address], [place] (336) 641-7733 Accepts children up to age 21 who are enrolled in Medicaid or  Health Choice; pregnant women with a Medicaid card; and children who have applied for Medicaid or  Health Choice, but were declined, whose parents can pay a reduced fee at time of service.  °[facility] ° [address], [place] (336) 641-4533 Patients are seen by appointment only. Walk-ins are not accepted. [facility] will see patients 18 years of age and older. °Monday - Tuesday (8am-5pm) °Most Wednesdays (8:30-5pm) °$30 per visit, cash only  °[facility] ° [address], [place] (336) 641-4533 Patients are seen by appointment only. Walk-ins are not accepted. [facility] will see patients 18 years of age and older. °One   Wednesday Evening (Monthly: Volunteer Based).  $30 per visit, cash only  °[facility]  (919) 537-3737 for adults; Children under age 4, call Graduate Pediatric Dentistry at ([phone]) [phone]. Children aged 4-14, please call (919) [phone] to request a pediatric application. ° Dental services are provided in all areas of dental care including fillings, crowns and bridges, complete and partial dentures, implants, gum treatment, root canals, and extractions. Preventive care is also provided. Treatment is provided to both adults and children. °Patients are selected via a lottery and there is often a waiting list. °  °[facility] [address], °[place] ° (336) 763-8833 [email or web address] °  °[facility] [address], [place], [place] (336)723-1848, Ext. 123 Second and Fourth Thursday of each month, opens at 6:30 AM; Clinic ends at 9 AM.  Patients are seen on a first-come first-served basis, and a limited number are seen during each clinic.  ° °[facility] ° [address], [place], [place] (336) 723-7904    Eligibility Requirements °You must have lived in [place], [place], or [place] counties for at least the last three months. °  You cannot be eligible for state or federal sponsored healthcare insurance, including Veterans Administration, Medicaid, or Medicare. °  You generally cannot be eligible for healthcare insurance through your employer.  °  How to apply: °Eligibility screenings are held every Tuesday and Wednesday afternoon from 1:00 pm until 4:00 pm. You do not need an appointment for the interview!  °[facility] [address], [place], [place] 336-631-2330   °[facility] [facility]  336-342-8273   °[facility]  336-703-3100   °[facility]  336-570-6415   ° °Behavioral Health Resources in the Community: °Intensive Outpatient Programs °Organization         Address  Phone  Notes  °[facility] [address], [place], [place] 336-878-6098   °[facility] [address], [place], [place] 336-832-9800   °[facility] [address], [place], [place] ° 336-882-2125   °[facility] [address],  °[place], [place] 1-800-853-5163 or 336-641-4981   °Substance Abuse Resources °Organization         Address  Phone  Notes  °[facility]  336-882-2125   °[facility]  336-784-9470   °[facility]  336-285-9073   °[facility]  336-845-3988   °Residential & Outpatient Substance Abuse Program  1-800-659-3381   °Psychological Services °Organization         Address  Phone  Notes  °[facility]  336- 832-9600   °[facility]  336- 378-7881   °[facility] [address], [place] 1-800-853-5163 or 336-641-4981   ° °Mobile Crisis Teams °Organization         Address  Phone  Notes  °[facility], Mobile Crisis Care Unit  1-877-626-1772   °Assertive °[facility] ° [address]  [place], [place] 336-834-9664   °Sharon DeEsch [address] °[place] 336-554-5454   ° °Self-Help/Support Groups °Organization         Address  Phone             Notes  °[facility] - variety of support groups  336- 373-1402 Call for more information  °Narcotics Anonymous (NA), [facility] [address], °[place]   2 meetings at this location  ° °  Residential Treatment Programs °Organization         Address  Phone  Notes  °[facility] [address],    °[place]  1-866-801-8205   °[facility] ° [address], [place], [place] 704-293-8524   °[facility] [address], [place] 336-845-3988 Admissions: 8am-3pm M-F  °[facility] [address]    °[place], [place] 336-841-1104   °[facility] [address], [place], [place] 336-379-7146   °[facility] [address]  °[place], [place] 336-285-9073   °[facility] - Intensive Outpatient [address], [place], [place] 336-852-3033   °[facility] [address]  °[place], [place] 1-877-615-2722 or 336-784-9470   °[facility] [address] [place], [place] 336-227-7417 Accepts Medicaid  °[facility] [address]  °[place] 1-800-659-3381 Substance Abuse/Addiction Treatment  ° °[place] Behavioral Health Resources °Organization         Address  Phone  Notes  °[facility]  (888) 581-9988   °Julie Brannon, PhD [address] [place], [place]   (336) 349-5553 or (336) 951-0000   °[facility]   [address] °[place], [place] (336) 349-4454   °[facility] [address], [place], [place] (336) 342-8316 Insurance/Medicaid/sponsorship through [facility]  °[facility] [address]                                    [place], [place] (336) 342-8316 Therapy/tele-psych/case    °[facility] [address]  ° [place], [place] (336) 349-2233    °Dr. Arfeen  (336) 349-4544   °[facility] [place]  [employer] [place] [facility] 1) [address], [place] °2) [address], [place] °3)  [address], [place] (336) 349-3220 °(336) 342-7768 ° °(336) 342-8140   °[place] [facility] (336) 342-1394 or (336) 342-3537 (After Hours)    ° ° °
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
## 2014-01-14 ENCOUNTER — Ambulatory Visit (INDEPENDENT_AMBULATORY_CARE_PROVIDER_SITE_OTHER): Payer: Self-pay

## 2014-01-14 ENCOUNTER — Ambulatory Visit (INDEPENDENT_AMBULATORY_CARE_PROVIDER_SITE_OTHER): Payer: Self-pay | Admitting: Internal Medicine

## 2014-01-14 VITALS — BP 110/70 | HR 94 | Temp 98.3°F | Resp 16 | Ht 65.0 in | Wt 220.0 lb

## 2014-01-14 DIAGNOSIS — M25571 Pain in right ankle and joints of right foot: Secondary | ICD-10-CM

## 2014-01-14 DIAGNOSIS — M25579 Pain in unspecified ankle and joints of unspecified foot: Secondary | ICD-10-CM

## 2014-01-14 NOTE — Progress Notes (Addendum)
   Subjective:   Patient ID: Brittany [name], female    DOB: 08/11/1975, 38 y.o.   MRN: [id number]  This chart was scribed for Ellamae Sia, MD by Milly Jakob, ED Scribe. The patient was seen in room 4. Patient's care was started at 8:42 PM.  HPI HPI Comments: Brittany [name] is a 38 y.o. female who presents to the [facility] Complaining of sharp, burning pain and yellow color to her right foot after falling and twisting it yesterday. She reports that the pain is exacerbated by pressure and palpation. She reports difficulty walking due to the pain. She states that she heard her right foot "pop" when she was in the shower. She denies any other injury or pain.    Review of Systems  Noncontributory.   Objective:   Physical Exam  Constitutional: She is oriented to person, place, and time. She appears well-developed and well-nourished.  HENT:  Head: Normocephalic and atraumatic.  Eyes: Conjunctivae and EOM are normal. Pupils are equal, round, and reactive to light.  Musculoskeletal:  Right ankle is generally swollen and has pain with all range of motion. Tender in the medial malleolus, lateral malleolus, and proximal forefoot. Pulses intact. Sensation intact.   Neurological: She is alert and oriented to person, place, and time.   [facility] reading (PRIMARY) by  Dr.Ellena Kamen=no fx.   Assessment & Plan:   I have completed the patient encounter in its entirety as documented by the scribe, with editing by me where necessary. Brittany [name] P. Merla Riches, M.D.  Right ankle pain - Plan: DG Ankle Complete Right  sprain her ankle and foot  She will use Ace wrap and crutches with ice elevation and range of motion and followup in 3 weeks if not well

## 2014-12-17 ENCOUNTER — Ambulatory Visit (INDEPENDENT_AMBULATORY_CARE_PROVIDER_SITE_OTHER): Payer: Self-pay | Admitting: Gynecology

## 2014-12-17 ENCOUNTER — Other Ambulatory Visit: Payer: Self-pay

## 2014-12-17 ENCOUNTER — Encounter: Payer: Self-pay | Admitting: Gynecology

## 2014-12-17 VITALS — BP 130/86 | Ht 62.5 in | Wt 238.0 lb

## 2014-12-17 DIAGNOSIS — N760 Acute vaginitis: Secondary | ICD-10-CM

## 2014-12-17 DIAGNOSIS — N3000 Acute cystitis without hematuria: Secondary | ICD-10-CM

## 2014-12-17 DIAGNOSIS — Z79890 Hormone replacement therapy (postmenopausal): Secondary | ICD-10-CM

## 2014-12-17 DIAGNOSIS — E8940 Asymptomatic postprocedural ovarian failure: Secondary | ICD-10-CM | POA: Insufficient documentation

## 2014-12-17 DIAGNOSIS — Z01419 Encounter for gynecological examination (general) (routine) without abnormal findings: Secondary | ICD-10-CM

## 2014-12-17 DIAGNOSIS — N958 Other specified menopausal and perimenopausal disorders: Secondary | ICD-10-CM

## 2014-12-17 LAB — URINALYSIS W MICROSCOPIC + REFLEX CULTURE
CASTS: NONE SEEN [LPF]
CRYSTALS: NONE SEEN [HPF]
Yeast: NONE SEEN [HPF]

## 2014-12-17 LAB — COMPREHENSIVE METABOLIC PANEL
ALK PHOS: 91 U/L (ref 33–115)
ALT: 28 U/L (ref 6–29)
AST: 24 U/L (ref 10–30)
Albumin: 4.2 g/dL (ref 3.6–5.1)
BILIRUBIN TOTAL: 0.6 mg/dL (ref 0.2–1.2)
BUN: 16 mg/dL (ref 7–25)
CO2: 21 mmol/L (ref 20–31)
CREATININE: 0.78 mg/dL (ref 0.50–1.10)
Calcium: 9.1 mg/dL (ref 8.6–10.2)
Chloride: 107 mmol/L (ref 98–110)
GLUCOSE: 108 mg/dL — AB (ref 65–99)
Potassium: 3.6 mmol/L (ref 3.5–5.3)
Sodium: 139 mmol/L (ref 135–146)
Total Protein: 6.8 g/dL (ref 6.1–8.1)

## 2014-12-17 LAB — CBC WITH DIFFERENTIAL/PLATELET
BASOS PCT: 1 % (ref 0–1)
Basophils Absolute: 0.1 10*3/uL (ref 0.0–0.1)
Eosinophils Absolute: 0.1 10*3/uL (ref 0.0–0.7)
Eosinophils Relative: 2 % (ref 0–5)
HEMATOCRIT: 40.4 % (ref 36.0–46.0)
Hemoglobin: 13.9 g/dL (ref 12.0–15.0)
Lymphocytes Relative: 38 % (ref 12–46)
Lymphs Abs: 2.5 10*3/uL (ref 0.7–4.0)
MCH: 32.7 pg (ref 26.0–34.0)
MCHC: 34.4 g/dL (ref 30.0–36.0)
MCV: 95.1 fL (ref 78.0–100.0)
MONO ABS: 0.4 10*3/uL (ref 0.1–1.0)
MONOS PCT: 6 % (ref 3–12)
MPV: 10.8 fL (ref 8.6–12.4)
NEUTROS ABS: 3.5 10*3/uL (ref 1.7–7.7)
Neutrophils Relative %: 53 % (ref 43–77)
Platelets: 245 10*3/uL (ref 150–400)
RBC: 4.25 MIL/uL (ref 3.87–5.11)
RDW: 13.7 % (ref 11.5–15.5)
WBC: 6.6 10*3/uL (ref 4.0–10.5)

## 2014-12-17 LAB — WET PREP FOR TRICH, YEAST, CLUE
TRICH WET PREP: NONE SEEN
YEAST WET PREP: NONE SEEN

## 2014-12-17 LAB — LIPID PANEL
CHOL/HDL RATIO: 4.5 ratio (ref <=5.0)
Cholesterol: 221 mg/dL — ABNORMAL HIGH (ref 125–200)
HDL: 49 mg/dL (ref >=46)
LDL CALC: 142 mg/dL — AB (ref <130)
Triglycerides: 151 mg/dL — ABNORMAL HIGH (ref <150)
VLDL: 30 mg/dL (ref <30)

## 2014-12-17 LAB — TSH: TSH: 0.859 u[IU]/mL (ref 0.350–4.500)

## 2014-12-17 MED ORDER — NITROFURANTOIN MONOHYD MACRO 100 MG PO CAPS: 100.0000 mg | ORAL_CAPSULE | Freq: Two times a day (BID) | ORAL | Status: DC

## 2014-12-17 MED ORDER — METRONIDAZOLE 500 MG PO TABS: 500.0000 mg | ORAL_TABLET | Freq: Two times a day (BID) | ORAL | Status: DC

## 2014-12-17 MED ORDER — EST ESTROGENS-METHYLTEST 0.625-1.25 MG PO TABS
1.0000 | ORAL_TABLET | Freq: Every day | ORAL | Status: DC
Start: 2014-12-17 — End: 2015-08-12

## 2014-12-17 MED ORDER — LISINOPRIL-HYDROCHLOROTHIAZIDE 10-12.5 MG PO TABS: 1.0000 | ORAL_TABLET | Freq: Every day | ORAL | Status: DC

## 2014-12-17 NOTE — Patient Instructions (Addendum)
Esterified Estrogens; Methyltestosterone tablets Qu es este medicamento? Los ESTRGENOS ESTERIFICADOS; [place] son Ardelia Mems combinacin de hormonas. Este medicamento se [employer] tratamiento de algunos de los sntomas de la menopausia, tales como los sofocos y la sequedad vaginal. Este medicamento puede ser utilizado para otros usos; si tiene alguna pregunta consulte con su proveedor de atencin mdica o con su farmacutico. MARCAS COMERCIALES DISPONIBLES: Covaryx, Covaryx H.S., EEMT, EEMT HS, Essian, Essian HS, Estratest, Estratest HS, Syntest DS, Syntest HS Qu le debo informar a mi profesional de la salud antes de tomar este medicamento? Necesita saber si usted presenta alguno de los siguientes problemas o situaciones: -sangrado vaginal anormal -enfermedad vascular o cogulos sanguneos -cncer de mama, cervical, endometrio, ovario, hgado o uterino -demencia -diabetes -enfermedad de la vescula biliar -enfermedad cardiaca o ataque cardiaco reciente -alta presin sangunea -niveles elevados de colesterol -nivel elevado de calcio en la sangre -histerectoma -enfermedad renal -enfermedad heptica -migraas -derrame cerebral -lupus eritematoso sistmico (LES) -fumador -sangrado vaginal -una reaccin alrgica o inusual a los estrgenos, otras hormonas, otros medicamentos, alimentos, colorantes o conservantes -si est embarazada o buscando quedar embarazada -si est amamantando a un beb Cmo debo utilizar este medicamento? Tome este medicamento por va oral con un vaso de agua. Puede tomar este medicamento con alimentos para reducir la nusea. Siga las instrucciones de la etiqueta del Justin. Tome Coca[employer]Cola siempre a la misma Buyer, retail. No tome su medicamento con una frecuencia mayor a la indicada. Hable con su pediatra para informarse acerca del uso de este medicamento en nios. Puede requerir atencin especial. Usted recibir un prospecto para el paciente  para este producto con cada receta y relleno. Asegrese de leer este folleto cada vez cuidadosamente. Este folleto puede cambiar con frecuencia. Sobredosis: Pngase en contacto inmediatamente con un centro toxicolgico o una sala de urgencia si usted cree que haya tomado demasiado medicamento. ATENCIN: [employer] es solo para usted. No comparta este medicamento con nadie. Qu sucede si me olvido de una dosis? Si olvida una dosis, tmela lo antes posible. Si es casi la hora de la prxima dosis, tome slo esa dosis. No tome dosis adicionales o dobles. Qu puede interactuar con este medicamento? No tome esta medicina con ninguno de los siguientes medicamentos: -medicamentos para Science writer, tales como aminoglutetimida, anastrozol, exemestno, letrozol, Music therapist, vorozol Esta medicina tambin puede interactuar con los siguientes medicamentos: -antibiticos, tales como eritromicina, claritromicina -carbamazepina -hormonas femeninas, como estrgenos, progestinas o pldoras anticonceptivas -jugo de toronja -remedios a base de hierbas para la menopausia o para problemas femeninos -insulina -itraconazol -quetoconazol -medicamentos para tratan o previenen los cogulos sanguneos como warfarina -oxifenbutazona -fenobarbital -rifampicina -ritonavir -hierba de [place] Puede ser que esta lista no menciona todas las posibles interacciones. Informe a su profesional de [employer] Home	[place] de [employer] productos a base de hierbas, medicamentos de [place] o suplementos nutritivos que est tomando. Si usted fuma, consume bebidas alcohlicas o si utiliza drogas ilegales, indqueselo tambin a su profesional de KB Home	[place]. Algunas sustancias pueden interactuar con su medicamento. A qu debo estar atento al usar Coca-Cola? Visite a su mdico o a su profesional de la salud para chequear su evolucin peridicamente. Debe hacerse exmenes de [place]apan[place] y exmenes de las mamas y la pelvis en forma  regular mientras est tomando este medicamento. Tambin debe discutir con su profesional de la salud la necesidad de Sport and exercise psychologist peridicamente y seguir las pautas que este profesional establezca para estas pruebas. Este medicamento puede hacer que su cuerpo Counselling psychologist  lquido, lo que puede provocar que se le hinchen los dedos, manos o tobillos. Su presin sangunea Regulatory affairs officer. Comunquese con su mdico o con su profesional de la salud si siente que est reteniendo lquido. Si tiene algn motivo para pensar que est embarazada, deje de tomar este medicamento de inmediato y comunquese con su mdico o con su profesional de Technical sales engineer. El fumar tabaco mientras toma este medicamento aumenta el riesgo de formacin de cogulos o de sufrir un derrame cerebral, especialmente si tiene ms de 35 aos de edad. Se le recomienda enfticamente que no fume. Si [place] lentes de contacto y observa cambios en la visin, o si los lentes comienzan a resultarle incmodos, consulte con su mdico de los ojos o con su profesional de KB Home	[place]. Este medicamento incrementa el riesgo de desarrollar una enfermedad (hiperplasia del endometrio) que puede derivar en cncer del revestimiento del tero. Al tomar otra hormona llamada progestina juntamente con este medicamento, se disminuye el riesgo de desarrollar esta enfermedad. Por lo tanto, si no le han extirpado el tero (por medio de una histerectoma), su mdico puede recetarle progestina para acompaar el tratamiento con estrgeno. Sin embargo, debe saber que puede correr [employer] adicionales para la salud al tomar estrgenos con progestina. Debe consultar sobre el uso de estrgenos y progestinas a su profesional de la salud a fin de Aeronautical engineer beneficios y Gaffer que puede [place]. Si va a someterse a una operacin, tal vez deba dejar de tomar este medicamento. Consulte con su profesional de la salud antes de Editor, commissioning operacin. Qu efectos secundarios puedo tener al  [employer] este medicamento? Efectos secundarios que debe informar a su mdico o a Barrister's clerk de la salud tan pronto como sea posible: -Chief of Staff como erupcin cutnea, picazn o urticarias, hinchazn de la cara, labios o lengua -secreciones o cambios en el tejido de las mamas -cambios en la visin -dolor en el pecho -confusin, dificultad para hablar o entender -orina de color amarillo oscuro -sensacin general de estar enfermo o sntomas gripales -heces claras -nuseas, vmito -dolor, hinchazn, sensacin clida en las piernas -dolor en la parte superior del abdomen -dolores de cabeza severos -falta de aliento -debilidad o entumecimiento repentino de la cara, brazos o piernas -problemas para caminar, mareos, prdida de la coordinacin o equilibrio -sangrado vaginal inusual -color amarillento de los ojos o la piel Efectos secundarios que, por lo general, no requieren Geophysical data processor (debe informarlos a su mdico o a Barrister's clerk de la salud si persisten o si son molestos): -cada del cabello -aumento de apetito o sed -aumento de descargas de orina -sntomas de una infeccin vaginal, como picazn, irritacin o flujo inusual -cansancio o debilidad inusual Puede ser que esta lista no menciona todos los posibles efectos secundarios. Comunquese a su mdico por asesoramiento mdico [employer]. Usted puede informar los efectos secundarios a la FDA por telfono al 1-800-FDA-1088. Dnde debo guardar mi medicina? Mantngala fuera del alcance de los nios. Gurdela a [employer], entre 15 y 33 grados C (4 y 40 grados F). Deseche todo el medicamento que no haya utilizado, despus de la fecha de vencimiento. ATENCIN: Este folleto es un resumen. Puede ser que no cubra toda la posible informacin. Si usted tiene preguntas acerca de esta medicina, consulte con su mdico, su farmacutico o su profesional de Technical sales engineer.  2015, Elsevier/Gold Standard.  (2008-06-13 14:18:18) Terapia de reemplazo hormonal (Hormone Replacement Therapy) En la menopausia, su cuerpo comienza a producir menos estrgeno y Immunologist. Esto  provoca que el cuerpo deje de tener perodos [place]. Esto se debe a que el estrgeno y la progesterona controlan sus perodos y su ciclo menstrual. Ardelia Mems falta de estrgeno puede causar sntomas tales como:  Social research officer, government.  Sequedad vaginal  Piel seca.  Prdida del deseo sexual.  Riesgo de prdida de hueso (osteoporosis). Cuando esto ocurre, puede elegir realizar Ardelia Mems terapia hormonal para volver a Clinical research associate estrgeno perdido [employer]. Cuando slo se introduce esta hormona, el procedimiento se conoce normalmente como TRE (terapia de reemplazo de [place]). Cuando la hormona progestina se combina con el estrgeno, el procedimiento se conoce normalmente como TH (terapia hormonal). Esto es lo que previamente se conoca como terapia de reemplazo hormonal (TRH). El profesional que le asiste le ayudar a tomar una decisin acerca de lo que resulte lo mejor para usted. La decisin de realizar una TRH cambia a menudo debido a que se Risk manager. Muchos estudios no ponen de acuerdo con respecto a los beneficios de Optometrist una terapia de reemplazo hormonal.  BENEFICIOS PROBABLES DE LA TRH QUE INCLUYEN PROTECCIN CONTRA:  Golpes de calor - Un golpe de calor es la sensacin repentina de calor sobre la cara y el cuerpo. La piel enrojece, como al sonrojarse. Estn asociados con la transpiracin y los trastornos del sueo. Las mujeres que atraviesan la menopausia pueden tener golpes de calor unas pocas veces en el mes o varios al da; esto depende de la mujer.  Osteoporosis (prdida de hueso) - El estrgeno ayuda a protegerse contra la prdida de [place]. Luego de la [place], los huesos de una mujer pierden calcio y se vuelven frgiles y Publishing rights manager. Como resultado, es ms probable que el hueso se [place]. Los que  resultan afectados con mayor frecuencia son los de la cadera, la [place] y la columna vertebral. La terapia hormonal puede ayudar a retardar la prdida de hueso luego de la menopausia. Realizar ejercicios con peso y tomar calcio con vitamina D tambin puede ayudar a prevenir la prdida de [place]. Existen medicamentos que puede prescribir el profesional que la asiste para ayudar a prevenir la osteoporosis.  Sequedad vaginal - La prdida de estrgeno produce cambios en la vagina. El recubrimiento de la misma puede volverse fino y Education officer, museum. Estos cambios pueden causar dolor y [place]. La sequedad tambin puede producir una infeccin. Puede ocasionarle ardor y [place].  Las infecciones en las vas urinarias son ms comunes luego de la menopausia debido a la falta de Oceanographer.  Otros beneficios posibles del estrgeno incluyen un cambio positivo en el humor y en la memoria de corto plazo en las mujeres. EFECTOS SECUNDARIOS Y RIESGOS  Utilizar estrgeno slo sin progesterona causa que el recubrimiento del tero crezca. Esto aumenta el riesgo de cncer endometrial. El profesional que la asiste deber darle otra hormona llamada progestina, si usted tiene tero.  Las mujeres que realizan una TH combinada (estrgeno y progestina) parecen tener un mayor riesgo de sufrir cncer de mama. El riesgo parece ser [place], [place] aumenta a lo largo del tiempo que se realice la [place].  La terapia combinada tambin hace que el tejido mamario sea levemente ms denso, lo que hace que sea ms difcil leer mamografas (radiografas de mama).  Combinada, la terapia de estrgeno y Immunologist puede realizarse todos los das, en cuyo caso podrn Warehouse manager de [place]. La TH puede realizarse de [place] cclica, en cuyo caso tendr perodos menstruales.  La TH puede aumentar el riesgo de [place], ataque cardaco,  cncer de mama y formacin de cogulos en la pierna. TRATAMIENTO  Si decide realizar  Neomia Dear TH y tiene tero, normalmente se prescribe el uso de estrgeno y progestina.  El profesional que la asiste le ayudar a decidir la mejor forma de [employer].  Lo mejor es Occupational hygienist dosis posible que pueda ayudarla con sus sntomas y tomarlos durante la menor cantidad de tiempo posible.  La terapia hormonal puede ayudar a Best boy de los problemas (sntomas) que afectan a las mujeres durante la menopausia. Antes de tomar una decisin con respecto a la TH, converse con el profesional que la asiste acerca de qu es lo mejor para usted. Mantngase bien informada y sintase cmoda con sus decisiones. INSTRUCCIONES PARA EL CUIDADO DOMICILIARIO:  Siga las indicaciones del profesional con respecto a cmo Recruitment consultant.  Hgase controles de [place] regular, e incluya [place] y [place]. SOLICITE ATENCIN MDICA DE INMEDIATO SI PRESENTA:  Hemorragia vaginal anormal.  Dolor o inflamacin en las piernas, falta de aliento o Journalist, newspaper.  Mareos o dolores de [place].  Protuberancias o cambios en sus mamas o axilas.  Pronunciacin inarticulada.  Debilidad o adormecimiento en los brazos o las piernas.  Dolor, ardor o sangrado al [employer].  Dolor abdominal. Document Released: 10/12/2007 Document Revised: 07/18/2011 ExitCare Patient Information 2015 [place], [place]. This information is not intended to replace advice given to you by your health care provider. Make sure you discuss any questions you have with your health care provider.Nitrofurantoin tablets or capsules Qu es este medicamento? La NITROFURANTONA es un antibitico. Se utiliza en el tratamiento de la infecciones del tracto urinario. Este medicamento puede ser utilizado para otros usos; si tiene alguna pregunta consulte con su proveedor de atencin mdica o con su farmacutico. MARCAS COMERCIALES DISPONIBLES: Macrobid, Macrodantin, Urotoin Qu le debo informar a mi profesional de la salud  antes de tomar este medicamento? Necesita saber si usted presenta alguno de los [employer] o situaciones: -anemia -diabetes -deficiencia de glucosa-6-fosfato deshidrogenasa -enfermedad renal -enfermedad heptica -enfermedad pulmonar -otras enfermedades crnicas -una reaccin alrgica o inusual a la nitrofurantona, a otros antibiticos, a otros medicamentos, alimentos, colorantes o conservantes -si est embarazada o buscando quedar embarazada -si est amamantando a un beb Cmo debo utilizar este medicamento? Tome este medicamento por va oral con un vaso de agua. Siga las instrucciones de la etiqueta del [place]. Tome este medicamento con leche o con alimentos. Tome sus dosis a intervalos regulares. No tome su medicamento con una frecuencia mayor que la indicada. No deje de tomarlo excepto si as lo indica su mdico. Hable con su pediatra para informarse acerca del uso de este medicamento en nios. Aunque este medicamento se puede recetar para condiciones selectivas, las precauciones se aplican. Sobredosis: Pngase en contacto inmediatamente con un centro toxicolgico o una sala de urgencia si usted cree que haya tomado demasiado medicamento. ATENCIN: [employer] es solo para usted. No comparta este medicamento con nadie. Qu sucede si me olvido de una dosis? Si olvida una dosis, tmela lo antes posible. Si es casi la hora de la prxima dosis, tome slo esa dosis. No tome dosis adicionales o dobles. Qu puede interactuar con este medicamento? -anticidos que contienen trisilicato de magnesio -probenecid -antibiticos quinolnicos, tales como ciprofloxacina, lomefloxacino, norfloxacino y ofloxacino -sulfapirazona Puede ser que esta lista no menciona todas las posibles interacciones. Informe a su profesional de [employer] de [employer] productos a base de hierbas, medicamentos de venta libre o suplementos nutritivos que  est tomando. Si usted fuma, consume bebidas  alcohlicas o si utiliza drogas ilegales, indqueselo tambin a su profesional de [employer]. Algunas sustancias pueden interactuar con su medicamento. A qu debo estar atento al usar [employer] Corporation? Si los sntomas no mejoran o si experimenta nuevos sntomas, consulte con su mdico o su profesional de [employer]. Beba varios vasos de Warehouse manager. Si toma este medicamento durante un perodo de [employer], debe visitar a su mdico para chequear su evolucin peridicamente. Si es diabtico, podr Barista un resultado positivo falso en los [employer] de determinacin del nivel de azcar en la orina con algunas marcas de [place] de [place]. Consulte con su mdico. Qu efectos secundarios puedo tener al [employer] este medicamento? Efectos secundarios que debe informar a su mdico o a Producer, television/film/video de la salud tan pronto como sea posible: -Therapist, art como erupcin cutnea o urticarias, hinchazn de la cara, labios o lengua -dolor en el pecho -tos -dificultad al respirar -mareos, somnolencia -fiebre o infeccin -molestias o dolores articulares -piel plida o teida azul -enrojecimiento, formacin de ampollas, descamacin o aflojamiento de la piel, inclusive dentro de la boca -hormigueo, ardor, Engineer, mining o entumecimiento de las manos o los pies -sangrado o magulladuras inusuales -cansancio o debilidad inusual -color amarillento de ojos o piel Efectos secundarios que, por lo general, no requieren Psychologist, prison and probation services (debe informarlos a su mdico o a su profesional de la salud si persisten o si son molestos): -orina de color amarillo oscuro -diarrea -dolor de cabeza -prdida del apetito -nuseas o vmitos -prdida del cabello temporal Puede ser que esta lista no menciona todos los posibles efectos secundarios. Comunquese a su mdico por asesoramiento mdico [employer]. Usted puede informar los efectos secundarios a la FDA por telfono al 1-800-FDA-1088. Dnde debo guardar  mi medicina? Mantngala fuera del alcance de los nios. Gurdela a [employer], entre 15 y 30 grados C (20 y 2 grados F). Protjala de la luz. Deseche todo el medicamento que no haya utilizado, despus de la fecha de vencimiento. ATENCIN: Este folleto es un resumen. Puede ser que no cubra toda la posible informacin. Si usted tiene preguntas acerca de esta medicina, consulte con su mdico, su farmacutico o su profesional de Radiographer, therapeutic.  2015, Elsevier/Gold Standard. (2004-12-10 17:03:00) Infeccin urinaria  (Urinary Tract Infection)  La infeccin urinaria puede ocurrir en Corporate treasurer del tracto urinario. El tracto urinario es un sistema de drenaje del cuerpo por el que se eliminan los desechos y el exceso de [place]. El tracto urinario est formado por dos riones, dos urteres, la vejiga y Engineer, mining. Los riones son rganos que tienen forma de frijol. Cada rin tiene aproximadamente el tamao del puo. Estn situados debajo de las [place], uno a cada lado de la columna vertebral CAUSAS  La causa de la infeccin son los microbios, que son organismos microscpicos, que incluyen hongos, virus, y bacterias. Estos organismos son tan pequeos que slo pueden verse a travs del microscopio. Las bacterias son los microorganismos que ms comnmente causan infecciones urinarias.  SNTOMAS  Los sntomas pueden variar segn la edad y el sexo del paciente y por la ubicacin de la infeccin. Los sntomas en las mujeres jvenes incluyen la necesidad frecuente e intensa de orinar y una sensacin dolorosa de ardor en la vejiga o en la uretra durante la miccin. Las mujeres y los hombres mayores podrn sentir cansancio, temblores y debilidad y Futures trader musculares y Engineer, mining abdominal. Si tiene [place], puede significar que la  infeccin est en los riones. Otros sntomas son dolor en la espalda o en los lados debajo de las [place], nuseas y vmitos.  DIAGNSTICO  Para diagnosticar una infeccin  urinaria, el mdico le preguntar acerca de sus sntomas. [employer] una [place] de [place]. La muestra de orina se analiza para Engineer, manufacturing bacterias y glbulos blancos de Risk manager. Los glbulos blancos se forman en el organismo para ayudar a Artist las infecciones.  TRATAMIENTO  Por lo general, las infecciones urinarias pueden tratarse con medicamentos. Debido a que la [employer]son de las infecciones son causadas por bacterias, por lo general pueden tratarse con antibiticos. La eleccin del antibitico y la duracin del tratamiento depender de sus sntomas y el tipo de bacteria causante de la infeccin.  INSTRUCCIONES PARA EL CUIDADO EN EL HOGAR   Si le recetaron antibiticos, tmelos exactamente como su mdico le indique. Termine el medicamento aunque se sienta mejor despus de haber tomado slo algunos.  Beba gran cantidad de lquido para mantener la orina de tono claro o color amarillo plido.  Evite la cafena, el t y las [address]. Estas sustancias irritan la vejiga.  Vaciar la vejiga con frecuencia. Evite retener la orina durante largos perodos.  Vace la vejiga antes y despus de Management consultant.  Despus de mover el intestino, las mujeres deben higienizarse la regin perineal desde adelante hacia atrs. Use slo un papel tissue por vez. SOLICITE ATENCIN MDICA SI:   Siente dolor en la espalda.  Le sube la fiebre.  Los sntomas no mejoran luego de [address]. SOLICITE ATENCIN MDICA DE INMEDIATO SI:   Siente dolor intenso en la espalda o en la zona inferior del abdomen.  Comienza a sentir escalofros.  Tiene nuseas o vmitos.  Tiene una sensacin continua de quemazn o molestias al [employer]. ASEGRESE DE QUE:   Comprende estas instrucciones.  Controlar su enfermedad.  Solicitar ayuda de inmediato si no mejora o empeora. Document Released: 02/02/2005 Document Revised: 01/18/2012 [facility] Patient Information 2015 [place], [place]. This information  is not intended to replace advice given to you by your health care provider. Make sure you discuss any questions you have with your health care provider.

## 2014-12-17 NOTE — Progress Notes (Signed)
Brittany [name] 1975-08-28 [id number]   History:    39 y.o.  for annual exam who is new to the practice. 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 Patient took HRT for very short time of a few months is not experiencing vasomotor symptoms, irritability, mood swings, vaginal dryness, and decreased sexual desire. Patient also having some pressure during time of urination. Patient also has history of hypertension and has not seen her PCP in over a year. She is currently taking lisinopril for hypertension. Patient denies any prior history of any abnormal Pap smears. Patient's currently on a weight reduction program and is currently on phentermine 37.5 mg daily as well.  Past medical history,surgical history, family history and social history were all reviewed and documented in the EPIC chart.  Gynecologic History Patient's last menstrual period was 01/08/2012. Contraception: status post hysterectomy Last Pap: Many years ago. Results were: normal Last mammogram: Not indicated. Results were: Not indicated  Obstetric History OB History  Gravida Para Term Preterm AB SAB TAB Ectopic Multiple Living  3 3        3     # Outcome Date GA Lbr Len/2nd Weight Sex Delivery Anes PTL Lv  3 Para     F Vag-Spont     2 Para     F Vag-Spont     1 Para     F Vag-Spont          ROS: A ROS was performed and pertinent positives and negatives are included in the history.  GENERAL: No fevers or chills. HEENT: No change in vision, no earache, sore throat or sinus congestion. NECK: No pain or stiffness. CARDIOVASCULAR: No chest pain or pressure. No palpitations. PULMONARY: No shortness of breath, cough or wheeze. GASTROINTESTINAL: No abdominal pain, nausea, vomiting or diarrhea, melena or bright red blood per rectum. GENITOURINARY: No urinary frequency, urgency, hesitancy or dysuria. MUSCULOSKELETAL: No joint or  muscle pain, no back pain, no recent trauma. DERMATOLOGIC: No rash, no itching, no lesions. ENDOCRINE: No polyuria, polydipsia, no heat or cold intolerance. No recent change in weight. HEMATOLOGICAL: No anemia or easy bruising or bleeding. NEUROLOGIC: No headache, seizures, numbness, tingling or weakness. PSYCHIATRIC: No depression, no loss of interest in normal activity or change in sleep pattern.     Exam: chaperone present  BP 130/86 mmHg  Ht 5' 2.5" (1.588 m)  Wt 238 lb (107.956 kg)  BMI 42.81 kg/m2  LMP 01/08/2012  Body mass index is 42.81 kg/(m^2).  General appearance : Well developed well nourished female. No acute distress HEENT: Eyes: no retinal hemorrhage or exudates,  Neck supple, trachea midline, no carotid bruits, no thyroidmegaly Lungs: Clear to auscultation, no rhonchi or wheezes, or rib retractions  Heart: Regular rate and rhythm, no murmurs or gallops Breast:Examined in sitting and supine position were symmetrical in appearance, no palpable masses or tenderness,  no skin retraction, no nipple inversion, no nipple discharge, no skin discoloration, no axillary or supraclavicular lymphadenopathy Abdomen: no palpable masses or tenderness, no rebound or guarding Extremities: no edema or skin discoloration or tenderness  Pelvic:  Bartholin, Urethra, Skene Glands: Within normal limits             Vagina: No gross lesions or discharge  Cervix: Absent  Uterus  absent  Adnexa  Without masses or tenderness  Anus and perineum  normal   Rectovaginal  normal sphincter tone without palpated masses or tenderness  Hemoccult not indicated   Wet prep few clue cells, many WBC too numerous to count bacteria  Urinalysis: Many bacteria, 3-10 RBC, 10-20 WBC  Assessment/Plan:  39 y.o. female for annual exam with surgical menopause symptomatic. We discussed hormone replacement therapy. The risks benefits and pros and cons were discussed as well as the potential risk of DVT and  pulmonary embolism. The women's health initiative study was discussed as well. She is going to be started on Estratest 0.625 one tablet by mouth daily. We discussed importance of calcium vitamin D and regular exercise for osteoporosis prevention. Patient will need a screening mammogram next year. The following screening fasting blood work was ordered: Comprehensive metabolic panel, fasting lipid profile, CBC, TSH. Pap smear not indicated. We discussed also importance of monthly self breast examination. For urinary tract infection she'll be prescribed Macrobid one by mouth twice a day for 7 days. She will be referred to her PCP to help monitor her hypertension.   Ok Edwards MD, 9:14 AM 12/17/2014

## 2014-12-18 ENCOUNTER — Other Ambulatory Visit: Payer: Self-pay | Admitting: Gynecology

## 2014-12-18 DIAGNOSIS — E7800 Pure hypercholesterolemia, unspecified: Secondary | ICD-10-CM

## 2014-12-20 LAB — URINE CULTURE: Colony Count: >=100000

## 2014-12-23 ENCOUNTER — Other Ambulatory Visit: Payer: Self-pay | Admitting: Gynecology

## 2014-12-23 MED ORDER — NITROFURANTOIN MONOHYD MACRO 100 MG PO CAPS
100.0000 mg | ORAL_CAPSULE | Freq: Two times a day (BID) | ORAL | Status: DC
Start: 2014-12-23 — End: 2015-02-06

## 2015-02-06 ENCOUNTER — Ambulatory Visit (INDEPENDENT_AMBULATORY_CARE_PROVIDER_SITE_OTHER): Payer: Self-pay | Admitting: Family Medicine

## 2015-02-06 VITALS — BP 130/68 | HR 112 | Temp 98.0°F | Resp 18 | Ht 63.0 in | Wt 231.0 lb

## 2015-02-06 DIAGNOSIS — M25562 Pain in left knee: Secondary | ICD-10-CM

## 2015-02-06 LAB — POCT CBC
Granulocyte percent: 49.5 %G (ref 37–80)
HEMATOCRIT: 44 % (ref 37.7–47.9)
HEMOGLOBIN: 14.6 g/dL (ref 12.2–16.2)
LYMPH, POC: 3.7 — AB (ref 0.6–3.4)
MCH, POC: 31.8 pg — AB (ref 27–31.2)
MCHC: 33.2 g/dL (ref 31.8–35.4)
MCV: 95.7 fL (ref 80–97)
MID (cbc): 0.7 (ref 0–0.9)
MPV: 7.2 fL (ref 0–99.8)
PLATELET COUNT, POC: 256 10*3/uL (ref 142–424)
POC GRANULOCYTE: 4.3 (ref 2–6.9)
POC LYMPH %: 42.5 % (ref 10–50)
POC MID %: 8 %M (ref 0–12)
RBC: 4.6 M/uL (ref 4.04–5.48)
RDW, POC: 13.4 %
WBC: 8.6 10*3/uL (ref 4.6–10.2)

## 2015-02-06 MED ORDER — PREDNISONE 20 MG PO TABS: ORAL_TABLET | ORAL | Status: AC

## 2015-02-06 NOTE — Patient Instructions (Addendum)
You could have gout or you could have an injury in your medial collateral ligament or in your meniscus.  I want you off of your knee for the next 3 days, take the prednisone medication starting tomorrow morning, drink tons of water, ice your knee for 20 minutes every 1-2 hours. Recheck in 1 week, sooner if worse, so we can tell you how to rehab your knee.    Prednisone should be taken all together in the morning: can cause hot flashes, increased appetite, lowered immune system, acid reflux/indigestion, insomnia, increased energy, anxiety/irritability.  But it is VERY effective for arthritis so worth trying.  Recheck in 1 week - sooner if worse or not improving.  Reposo, hielo, compresin y elevacin: Cuidados de rutina para las lesiones (RICE: Routine Care for Injuries) Los cuidados de rutina de muchas lesiones incluyen reposo, hielo, compresin y elevacin. INSTRUCCIONES PARA EL CUIDADO EN EL HOGAR  El reposo es necesario para permitir que el cuerpo se cure. Podrn reanudarse las actividades de rutina cuando se sienta cmodo. Las lesiones en tendones y huesos pueden tardar hasta seis semanas en curarse. Los tendones son estructuras similares a cuerdas que [employer] al [employer].  Si aplicamos hielo luego de una lesin, podemos evitar la hinchazn y Glass blower/designer.  Ponga el hielo en una bolsa plstica.  Colquese una toalla entre la piel y la bolsa de hielo.  Deje el hielo durante 15 a , 3 a 4veces por da, o segn las indicaciones del mdico. Hgalo mientras se encuentre despierto, durante las primeras 24 a 48 horas. Luego, contine segn las indicaciones del mdico.  La compresin ayuda a reducir la hinchazn. Tambin brinda apoyo y [place] a [employer]. Si hoy le han colocado un vendaje elstico, debe retirarlo y colocarlo nuevamente cada 3 o 4 horas. No debe aplicarlo de manera muy apretada, pero s con la firmeza necesaria para evitar la hinchazn. Controle los  dedos de las manos o de los pies y observe si se hinchan, se tornan azules, se enfran, se adormecen o si siente dolor intenso. Si se presentan algunos de estos sntomas, retire el vendaje y aplquelo nuevamente de un modo ms flojo. Comunquese con su mdico si contina con estos problemas.  La elevacin ayuda a reducir la hinchazn y Engineer, materials. En el caso de las extremidades, [employer], las [place], las piernas y [employer], Control and instrumentation engineer el rea lesionada por encima del nivel del corazn, si es posible. SOLICITE ATENCIN MDICA DE INMEDIATO SI:  El dolor o la hinchazn persisten.  La zona est roja, adormecida o la siente dbil.  Los sntomas empeoran en vez de mejorar durante [employer]. Estos sntomas pueden indicar que es necesaria una evaluacin ms profunda o nuevas radiografas. En algunos casos, las radiografas no muestran que hay un hueso pequeo roto (fractura) hasta 1semana o 10das ms tarde. Cumpla con las citas de seguimiento con el mdico. Consulte con su mdico la fecha en que los [place] de las radiografas estarn disponibles. Asegrese de [employer] de las radiografas. Document Released: 02/02/2005 Document Revised: 04/30/2013 [facility] Patient Information 2015 [place], [place]. This information is not intended to replace advice given to you by your health care provider. Make sure you discuss any questions you have with your health care provider.   Lquido en la rodilla (Knee Effusion) Usted tiene lquido en la rodilla. El trmino mdico para esto es efusin. Esto a menudo se debe a un trastorno interno de la  rodilla. Esto significa que algo no anda bien en esa articulacin. Algunas de las causas del lquido en la rodilla pueden ser un cartlago desgarrado, un desgarro de una parte de un ligamento o hemorragia en la articulacin debido a una lesin. Cuando esto ocurre, es ms difcil mover o doblar la rodilla. Esto se debe a que a menudo Product manager y la presin en la articulacin. El tiempo que tarda en recuperarse de una efusin de rodilla depende de diferentes factores, que incluyen:  Tipo de lesin  Edad  Enfermedades fsicas y mdicas.  Su determinacin El Engineer, maintenance sin Education officer, environmental sus actividades normales depender del tipo de problema que sufra en la rodilla y la cantidad de tejido lesionado. La rodilla tiene dos capas de TEFL teacher. El cartlago articular cubre el extremo del hueso y le permite a la rodilla doblarse y moverse suavemente. Dos meniscos (gruesas almohadillas de cartlago que forman un armazn dentro de la articulacin) absorben los impactos y estabilizan la rodilla. Los ligamentos unen los [employer] s y Insurance risk surveyor. Los msculos mueven la articulacin, ayudan a Furniture conservator/restorer rodilla y se tensan en la articulacin misma. CAUSAS A menudo un derrame articular en la rodilla est ocasionado por una lesin en uno de los meniscos. Este es a menudo un pequeo Water engineer. La recuperacin luego de una lesin en los meniscos depende del tamao de la lesin, y de si se ha daado otro tejido de la rodilla. Los pequeos desgarros generalmente se curan solos con Horticulturist, commercial. Conservador significa reposo y no Education officer, environmental ninguna actividad que suponga Artist. La recuperacin puede demorar hasta seis semanas.  TRATAMIENTO Los desgarros graves pueden requerir [place]. Las [employer] meniscos pueden tratarse durante la artroscopia. La artroscopa es un procedimiento en el cual el cirujano utiliza un pequeo instrumento parecido a un telescopio para observar la rodilla. El profesional podr Engineer, agricultural (determinar cul es el problema) ms preciso realizando una artroscopia. El profesional que lo asiste tambin podr Web designer durante la artroscopa. Si la lesin es en el borde interno del menisco, el cirujano podr recortar el menisco hasta una zona  sana. En otros casos, el cirujano tratar de reparar el menisco lesionado con una sutura (puntos). Esto podr hacer que la rehabilitacin tome ms tiempo, pero podr proveer un mejor estado de salud a largo plazo y Contractor a que la rodilla conserve su capacidad de Software engineer. Los ligamentos desgarrados por completo normalmente requieren [place] para su reparacin. INSTRUCCIONES PARA EL CUIDADO DOMICILIARIO  Utilice las muletas segn las instrucciones.  Si le colocan un aparato ortopdico, selo como le han indicado.  Una vez que se encuentre en su hogar, aplique una bolsa de hielo en la zona de la [place], lo que puede ayudarle a disminuir las molestias y Restaurant manager, fast food hinchazn.  Mantenga la rodilla elevada (hacia arriba) cuando no est parado o utilizando las [place].  Utilice los medicamentos de venta libre o de prescripcin para Chief Technology Officer, Environmental health practitioner o la [place], segn se lo indique el profesional que lo asiste.  El profesional que lo asiste lo ayudar con indicaciones para la rehabilitacin de la rodilla. Esto a menudo incluye ejercicios de estiramiento.  Puede reanudar su dieta y sus actividades normales segn se le haya indicado. SOLICITE ATENCIN MDICA SI:  Aumenta la inflamacin en la rodilla.  Presenta enrojecimiento, hinchazn o aumento del dolor en la rodilla.  Presenta una temperatura oral superior a 38,9 C (102  F). SOLICITE ATENCIN MDICA DE INMEDIATO SI:  Aparece una erupcin cutnea.  Presenta dificultades respiratorias.  Tiene alguna reaccin alrgica a los medicamentos que se le hayan administrado.  Siente dolor intenso al mover la rodilla. EST SEGURO QUE:   Comprende las instrucciones para el alta mdica.  Controlar su enfermedad.  Solicitar atencin mdica de inmediato segn las indicaciones. Document Released: 08/02/2007 Document Revised: 07/18/2011 [facility] Patient Information 2015 [place], [place]. This information is not intended to  replace advice given to you by your health care provider. Make sure you discuss any questions you have with your health care provider.

## 2015-02-06 NOTE — Progress Notes (Signed)
Subjective:  This chart was scribed for Delman Cheadle, MD by Thea Alken, ED Scribe. This patient was seen in room 14 and the patient's care was started at 6:03 PM.   Patient ID: Brittany [name], female    DOB: 11-26-1975, 39 y.o.   MRN: [id number]  HPI   Chief Complaint  Patient presents with  . Knee Pain    left knee, x 1 week after going to the gym, come and goes    HPI Comments: Brittany [name] is a 39 y.o. female who presents to the [facility] complaining of left knee pain and swelling that began 1 week ago. Pt has been working out and running and began to have pain after running 1 week ago.  She describes the pain as sharp with occasional throbbing as well as left knee weakness feeling as if her knee will give out on her. She has pain with both rest, flexion and with bearing weight. She denies injury prior. She has been taking 3 ibuprofen at a time a couple times a day. She has hx of HTN and has recently has blood work.  Pt speaks spanish but brought a family member with her today to translate.   Past Medical History  Diagnosis Date  . Pelvic mass   . Hypertension     NO MEDS CURRENTLY  . Constipation   . Gestational diabetes   . Depressed    No Known Allergies Prior to Admission medications   Medication Sig Start Date End Date Taking? Authorizing Provider  estrogen-methylTESTOSTERone 0.625-1.25 MG per tablet Take 1 tablet by mouth daily. 12/17/14  Yes Terrance Mass, MD  lisinopril-hydrochlorothiazide (PRINZIDE,ZESTORETIC) 10-12.5 MG per tablet Take 1 tablet by mouth daily. 12/17/14  Yes Terrance Mass, MD  phentermine 37.5 MG capsule Take 37.5 mg by mouth every morning.   Yes Historical Provider, MD  acetaminophen (TYLENOL) 325 MG tablet Take 325 mg by mouth every 6 (six) hours as needed (pain).    Historical Provider, MD  ferrous sulfate 325 (65 FE) MG tablet Take 1 tablet (325 mg total) by mouth 2 (two) times daily before a meal. 01/19/12  01/18/13  Lahoma Crocker, MD  Multiple Vitamins-Minerals (EQL MEGA SELECT WOMENS PO) Take 1 tablet by mouth daily.    Historical Provider, MD  nitrofurantoin, macrocrystal-monohydrate, (MACROBID) 100 MG capsule Take 1 capsule (100 mg total) by mouth 2 (two) times daily. Patient not taking: Reported on 02/06/2015 12/23/14   Terrance Mass, MD   Review of Systems  Musculoskeletal: Positive for arthralgias and gait problem.  Skin: Negative for color change, rash and wound.  Neurological: Positive for weakness. Negative for numbness.  Hematological: Does not bruise/bleed easily.    Objective:   Physical Exam  Constitutional: She is oriented to person, place, and time. She appears well-developed and well-nourished. No distress.  HENT:  Head: Normocephalic and atraumatic.  Eyes: Conjunctivae and EOM are normal.  Neck: Neck supple.  Cardiovascular: Normal rate.   Pulmonary/Chest: Effort normal.  Musculoskeletal: Normal range of motion.  No significant tenderness over medial and lateral joint line but severe pain over MCL. No significant tenderness over LCL. No tenderness over the head of fibula. No patellar instability. Positive crepitus bilaterally. Severe pain at the extremes of ROM but still has full ROM.  No varus or valgus laxity. Positive  McMurray's over medial aspect but maybe complicated with pain with flexion. Mild warmth to left knee. Mild effusion.   Neurological: She is alert  and oriented to person, place, and time.  Skin: Skin is warm and dry.  Psychiatric: She has a normal mood and affect. Her behavior is normal.  Nursing note and vitals reviewed.  Filed Vitals:   02/06/15 1749  BP: 130/68  Pulse: 112  Temp: 98 F (36.7 C)  TempSrc: Oral  Resp: 18  Height: '5\' 3"'  (1.6 m)  Weight: 231 lb (104.781 kg)  SpO2: 98%   Assessment & Plan:   1. Knee pain, acute, left    Possible MCL injury vs torn meniscus. Will treat with prednisone taper - when it is complete can resume  ibuprofen. Pt has been advised to start RICE on left knee, weight bearing minimized until pain improves. If symptoms worsen patient has been advised to return to office for xray. Pt is self-pay but will r/o gout with esr - low probability of inflammatory arthritis but as nki and warmth of knee will r/o gout w/ esr  Orders Placed This Encounter  Procedures  . POCT CBC  . POCT SEDIMENTATION RATE   Meds ordered this encounter  Medications  . predniSONE (DELTASONE) 20 MG tablet    Sig: Take 3 tabs po qd x 2d, 2 tabs po qd x 2d, 1 tab po qd x 2d, then stop    Dispense:  12 tablet    Refill:  0   By signing my name below, I, Raven Small, attest that this documentation has been prepared under the direction and in the presence of Delman Cheadle, MD.  Electronically Signed: Thea Alken, ED Scribe. 02/06/2015. 6:29 PM.  I personally performed the services described in this documentation, which was scribed in my presence. The recorded information has been reviewed and considered, and addended by me as needed.  Delman Cheadle, MD MPH  Results for orders placed or performed in visit on 02/06/15  POCT CBC  Result Value Ref Range   WBC 8.6 4.6 - 10.2 K/uL   Lymph, poc 3.7 (A) 0.6 - 3.4   POC LYMPH PERCENT 42.5 10 - 50 %L   MID (cbc) 0.7 0 - 0.9   POC MID % 8.0 0 - 12 %M   POC Granulocyte 4.3 2 - 6.9   Granulocyte percent 49.5 37 - 80 %G   RBC 4.6 4.04 - 5.48 M/uL   Hemoglobin 14.6 12.2 - 16.2 g/dL   HCT, POC 44.0 37.7 - 47.9 %   MCV 95.7 80 - 97 fL   MCH, POC 31.8 (A) 27 - 31.2 pg   MCHC 33.2 31.8 - 35.4 g/dL   RDW, POC 13.4 %   Platelet Count, POC 256.0 142 - 424 K/uL   MPV 7.2 0 - 99.8 fL  POCT SEDIMENTATION RATE  Result Value Ref Range   POCT SED RATE 20 0 - 22 mm/hr

## 2015-02-07 LAB — POCT SEDIMENTATION RATE: POCT SED RATE: 20 mm/hr (ref 0–22)

## 2015-07-24 ENCOUNTER — Other Ambulatory Visit: Payer: Self-pay

## 2015-07-24 DIAGNOSIS — E7800 Pure hypercholesterolemia, unspecified: Secondary | ICD-10-CM

## 2015-07-24 LAB — LIPID PANEL
Cholesterol: 244 mg/dL — ABNORMAL HIGH (ref 125–200)
HDL: 55 mg/dL (ref >=46)
LDL CALC: 168 mg/dL — AB (ref <130)
TRIGLYCERIDES: 107 mg/dL (ref <150)
Total CHOL/HDL Ratio: 4.4 Ratio (ref <=5.0)
VLDL: 21 mg/dL (ref <30)

## 2015-08-12 ENCOUNTER — Other Ambulatory Visit: Payer: Self-pay | Admitting: Gynecology

## 2015-08-18 NOTE — Telephone Encounter (Signed)
Generic Estratest called in to pharmacy.

## 2015-08-28 ENCOUNTER — Ambulatory Visit: Payer: Self-pay | Admitting: Gynecology

## 2015-09-07 ENCOUNTER — Ambulatory Visit: Payer: Self-pay | Admitting: Gynecology

## 2015-09-09 ENCOUNTER — Ambulatory Visit (INDEPENDENT_AMBULATORY_CARE_PROVIDER_SITE_OTHER): Payer: Self-pay | Admitting: Gynecology

## 2015-09-09 ENCOUNTER — Encounter: Payer: Self-pay | Admitting: Gynecology

## 2015-09-09 VITALS — BP 132/70 | Ht 63.0 in | Wt 235.0 lb

## 2015-09-09 DIAGNOSIS — E785 Hyperlipidemia, unspecified: Secondary | ICD-10-CM

## 2015-09-09 DIAGNOSIS — E663 Overweight: Secondary | ICD-10-CM

## 2015-09-09 DIAGNOSIS — E8940 Asymptomatic postprocedural ovarian failure: Secondary | ICD-10-CM

## 2015-09-09 DIAGNOSIS — Z79890 Hormone replacement therapy (postmenopausal): Secondary | ICD-10-CM

## 2015-09-09 DIAGNOSIS — N958 Other specified menopausal and perimenopausal disorders: Secondary | ICD-10-CM

## 2015-09-09 DIAGNOSIS — I10 Essential (primary) hypertension: Secondary | ICD-10-CM

## 2015-09-09 NOTE — Patient Instructions (Signed)
Ejercicios para perder peso (Exercise to Lose Weight) La actividad fsica y Neomia Dear dieta saludable ayudan a perder peso. El mdico podr sugerirle ejercicios especficos. IDEAS Y CONSEJOS PARA HACER EJERCICIOS  Elija opciones econmicas que disfrute hacer , como caminar, andar en bicicleta o los vdeos para ejercitarse.   Utilice las Microbiologist del ascensor.   Camine durante la hora del almuerzo.   Estacione el auto lejos del lugar de [address] o [place].   Concurra a un gimnasio o tome clases de gimnasia.   Comience con 5  10 minutos de actividad fsica por da. Ejercite hasta 30 minutos, 4 a 6 das por [address]   Utilice zapatos que tengan un buen soporte y ropas cmodas.   Elongue antes y despus de Company secretary.   Ejercite hasta que aumente la respiracin y el corazn palpite rpido.   Beba agua extra cuando ejercite.   No haga ejercicio Firefighter, sentirse mareado o que le falte mucho el aire.  La actividad fsica puede quemar alrededor de 150 caloras.  Correr 20 cuadras en 15 minutos.   Jugar vley durante 45 a 60 minutos.   Limpiar y encerar el auto durante 45 a 60 minutos.   Jugar ftbol americano de toque.   Caminar 25 cuadras en 35 minutos.   Empujar un cochecito 20 cuadras en 30 minutos.   Jugar baloncesto durante 30 minutos.   Rastrillar hojas secas durante 30 minutos.   Andar en bicicleta 80 cuadras en 30 minutos.   Caminar 30 cuadras en 30 minutos.   Bailar durante 30 minutos.   Quitar la nieve con una pala durante 15 minutos.   Nadar vigorosamente durante 20 minutos.   Subir escaleras durante 15 minutos.   Andar en bicicleta 60 cuadras durante 15 minutos.   Arreglar el jardn entre 30 y 45 minutos.   Saltar a la soga durante 15 minutos.   Limpiar vidrios o pisos durante 45 a 60 minutos.  Document Released: 07/30/2010 Document Revised: 01/05/2011 [facility] Patient Information 2012 [place], [place].Hipertensin (Hypertension) La  hipertensin, conocida comnmente como presin arterial alta, se produce cuando la sangre bombea en las arterias con mucha fuerza. Las arterias son los vasos sanguneos que transportan la sangre desde el corazn hacia todas las partes del cuerpo. Una lectura de la presin arterial consiste en un nmero ms alto sobre un nmero ms bajo, por ejemplo, 110/72. El nmero ms alto (presin sistlica) corresponde a la presin interna de las arterias cuando el corazn [place]. El nmero ms bajo (presin diastlica) corresponde a la presin interna de las arterias cuando el corazn se relaja. En condiciones ideales, la presin arterial debe ser inferior a 120/80. La hipertensin fuerza al corazn a trabajar ms para Marine scientist. Las arterias pueden estrecharse o ponerse rgidas. La hipertensin no tratada o no controlada puede causar infarto de miocardio, ictus, enfermedad renal y otros problemas. FACTORES DE RIESGO Algunos factores de riesgo de hipertensin son controlables, pero otros no lo son.  Entre los factores de riesgo que usted no puede Chief Operating Officer, se [employer] siguientes:   La raza. El riesgo es mayor para las Statistician.  La edad. Los riesgos aumentan con la edad.  El sexo. Antes de los 45aos, los hombres corren ms [employer]. Despus de los 65aos, las mujeres corren ms [employer]. Entre los factores de riesgo que usted puede Chief Operating Officer, se [employer] International siguientes:  No hacer la cantidad suficiente de actividad fsica o ejercicio.  Wilburt Finlay  sobrepeso.  Consumir mucha grasa, azcar, caloras o sal en la dieta.  Beber alcohol en exceso. SIGNOS Y SNTOMAS Por lo general, la hipertensin no causa signos o sntomas. La hipertensin arterial demasiado alta (crisis hipertensiva) puede causar dolor de cabeza, ansiedad, falta de aire y hemorragia nasal. DIAGNSTICO Para detectar si usted tiene hipertensin, el mdico le medir la presin arterial  mientras est sentado, con el brazo levantado a la altura del corazn. Debe medirla al [facility] veces en el mismo brazo. Determinadas condiciones pueden causar una diferencia de presin arterial entre el brazo izquierdo y Aeronautical engineer. El hecho de tener una sola lectura de la presin arterial ms alta que lo normal no significa que Research scientist (physical sciences). Si no est claro si tiene hipertensin arterial, es posible que se le pida que regrese otro da para volver a controlarle la presin arterial. O bien se le puede pedir que se controle la presin arterial en su casa durante 1 o ms meses. TRATAMIENTO El tratamiento de la hipertensin arterial incluye hacer cambios en el estilo de vida y, posiblemente, tomar medicamentos. Un estilo de vida saludable puede ayudar a bajar la presin arterial alta. Quiz deba cambiar algunos hbitos. Los cambios en el estilo de vida pueden incluir lo siguiente:  Seguir la dieta DASH. Esta dieta tiene un alto contenido de frutas, verduras y Radiation protection practitioner. Incluye poca cantidad de sal, carnes rojas y azcares agregados.  Mantenga el consumo de sodio por debajo de 2300 mg por da.  Realizar al [employer] 30 y 45 minutos de ejercicio [place], 4 veces por semana como mnimo.  Perder peso, si es necesario.  No fumar.  Limitar el consumo de bebidas alcohlicas.  Aprender formas de reducir el estrs. El mdico puede recetarle medicamentos si los cambios en el estilo de vida no son suficientes para Museum/gallery curator la presin arterial y si una de las siguientes afirmaciones es verdadera:  Cleone Slim 18 y 62 aos y su presin arterial sistlica est por encima de 140.  Tiene 60 aos o ms y su presin arterial sistlica est por encima de 150.  Su presin arterial diastlica est por encima de 90.  Tiene diabetes y su presin arterial sistlica est por encima de 140 o su presin arterial diastlica est por encima de 90.  Tiene una enfermedad renal y su  presin arterial est por encima de 140/90.  Tiene una enfermedad cardaca y su presin arterial est por encima de 140/90. La presin arterial deseada puede variar en funcin de las enfermedades, la edad y otros factores personales. INSTRUCCIONES PARA EL CUIDADO EN EL HOGAR  Haga que le midan de nuevo la presin arterial segn las indicaciones del mdico.  Tome los medicamentos solamente como se lo haya indicado el mdico. Siga cuidadosamente las indicaciones. Los medicamentos para la presin arterial deben tomarse segn las indicaciones. Los medicamentos pierden eficacia al omitir las dosis. El hecho de omitir las dosis tambin [place] el riesgo de otros problemas.  No fume.  Contrlese la presin arterial en su casa segn las indicaciones del mdico. SOLICITE ATENCIN MDICA SI:   Piensa que tiene una reaccin alrgica a los medicamentos.  Tiene mareos o dolores de cabeza con Naval architect.  Tiene hinchazn en los tobillos.  Tiene problemas de visin. SOLICITE ATENCIN MDICA DE INMEDIATO SI:  Siente un dolor de cabeza intenso o confusin.  Siente debilidad inusual, adormecimiento o que Hospital doctor.  Siente dolor intenso en el pecho o en el abdomen.  Vomita repetidas  veces.  Tiene dificultad para respirar. ASEGRESE DE QUE:   Comprende estas instrucciones.  Controlar su afeccin.  Recibir ayuda de inmediato si no mejora o si empeora.   Esta informacin no tiene Theme park manager el consejo del mdico. Asegrese de hacerle al mdico cualquier pregunta que tenga.   Document Released: 04/25/2005 Document Revised: 09/09/2014 Elsevier Interactive Patient Education 2016 [employer]. Atorvastatin tablets Qu es este medicamento? La ATORVASTATINA es conocido como un inhibidor de la HMG-CoA reductasa o 'estatina'. Reduce el nivel de colesterol y triglicridos en la sangre. Este medicamento tambin puede reducir el riesgo de Marine scientist ataques cardiacos, derrame cerebral u  otros problemas de la salud en pacientes que corren el riesgo de padecer una enfermedad cardiaca. Marnee Guarneri y cambios a su estilo de vida son a menudo utilizados con Industrial/product designer. Este medicamento puede ser utilizado para otros usos; si tiene alguna pregunta consulte con su proveedor de atencin mdica o con su farmacutico. Qu le debo informar a mi profesional de la salud antes de tomar este medicamento? Necesita saber si usted presenta alguno de los siguientes problemas o situaciones: -consume bebidas alcohlicas con frecuencia -antecedentes de derrame cerebral, TIA -enfermedad renal -enfermedad heptica -dolores o debilidades musculares -otra condicin mdica -una reaccin alrgica o inusual a la atorvastatina, a otros medicamentos, alimentos, colorantes o conservadores -si est embarazada o buscando quedar embarazada -si est amamantando a un beb Cmo debo utilizar este medicamento? Tome este medicamento por va oral con un vaso de agua. Siga las instrucciones de la etiqueta del [place]. Puede tomar este medicamento con o sin alimentos. Tome sus dosis a intervalos regulares. No tome su medicamento con una frecuencia mayor a la indicada. Hable con su pediatra para informarse acerca del uso de este medicamento en nios. Aunque este medicamento ha sido recetado a nios tan menores como de 10 aos de edad para condiciones selectivas, las precauciones se aplican. Sobredosis: Pngase en contacto inmediatamente con un centro toxicolgico o una sala de urgencia si usted cree que haya tomado demasiado medicamento. ATENCIN: [employer] es solo para usted. No comparta este medicamento con nadie. Qu sucede si me olvido de una dosis? Si olvida una dosis, tmela lo antes posible. Si es casi la hora de su dosis siguiente, tome slo esa dosis. No tome dosis adicionales o dobles. Qu puede interactuar con este medicamento? No tome esta medicina con ninguno de los siguientes  medicamentos: -levadura roja de arroz -telaprevir -telitromicina -voriconazol Esta medicina tambin puede interactuar con los siguientes medicamentos: -alcohol -medicamentos antivirales para el VIH o SIDA -boceprevir -ciertos antibiticos, tales como Facilities manager, eritromicina, troleandomicina -ciertos medicamentos para el colesterol, tales como fenofibrato o gemfibrozil -cimetidina -claritromicina -colchicina -ciclosporina -digoxina -hormonas femeninas, como estrgenos, progestinas o pldoras anticonceptivas -jugo de toronja -medicamentos para las infecciones micticas, tales como fluconazol, itraconazol, quetoconazol -niacina -rifampicina -espironolactona Puede ser que esta lista no menciona todas las posibles interacciones. Informe a su profesional de [employer] de [employer] productos a base de hierbas, medicamentos de [place] o suplementos nutritivos que est tomando. Si usted fuma, consume bebidas alcohlicas o si utiliza drogas ilegales, indqueselo tambin a su profesional de [employer]. Algunas sustancias pueden interactuar con su medicamento. A qu debo estar atento al usar [employer] Corporation? Visite a su mdico o a su profesional de la salud para chequeos peridicos. Puede necesitar exmenes regulares para asegurarse de que el hgado est funcionando bien. Informe a su mdico o a su profesional de la salud tan pronto como pueda si  tiene debilidad, sensibilidad o dolores musculares que no tienen explicacin, especialmente si tambin tiene fiebre y [place]. Su mdico o profesional de Radiographer, therapeutic puede informarle que deje de tomar este medicamento si desarrolla problemas musculares. Si sus problemas musculares no desaparecen despus de parar este medicamento, comunquese con su profesional de [employer]. Este medicamento es slo parte de un programa integral para la salud de Programmer, multimedia. Su mdico o dietista pueden sugerirle una dieta con bajo contenido de colesterol y de grasa para  ayudarle. Evite el alcohol y Art therapist, y [place] un programa adecuado de ejercicios fsicos. No utilice este medicamento si est embarazada o amamantando. Existe la posibilidad de efectos secundarios graves a un beb sin nacer o a Radio broadcast assistant. Para ms informacin hable con su farmacutico o su mdico. Este medicamento puede afectar su nivel de azcar en la sangre. Si tiene diabetes, consulte a su mdico o a su profesional de la salud antes de cambiar su dieta o la dosis de su medicamento para la diabetes. Si va a someterse a una operacin, informe a su profesional de la salud que est tomando [employer]. Qu efectos secundarios puedo tener al [employer] este medicamento? Efectos secundarios que debe informar a su mdico o a Producer, television/film/video de la salud tan pronto como sea posible: -Therapist, art como erupcin cutnea, picazn o urticarias, hinchazn de la cara, labios o lengua -orina de color amarillo oscuro -fiebre -dolores articulares -calambres, dolores musculares -enrojecimiento, formacin de ampollas, descamacin o distensin de la piel, inclusive dentro de la boca -dificultad para orinar o cambios en el volumen de orina -cansancio o debilidad inusual -color amarillento de ojos o piel Efectos secundarios que, por lo general, no requieren atencin mdica (debe informarlos a su mdico o a su profesional de la salud si persisten o si son molestos): -estreimiento -acidez de estmago -gas, dolor, Programme researcher, broadcasting/film/video Puede ser que esta lista no menciona todos los posibles efectos secundarios. Comunquese a su mdico por asesoramiento mdico [employer]lett[employer]ard. Usted puede informar los efectos secundarios a la FDA por telfono al 1-800-FDA-1088. Dnde debo guardar mi medicina? Mantngala fuera del alcance de los nios. Gurdela a una [employer] 20 y 25 grados C (21 y 29 grados F). Deseche todo el medicamento que no haya utilizado, despus de la fecha de  vencimiento. ATENCIN: Este folleto es un resumen. Puede ser que no cubra toda la posible informacin. Si usted tiene preguntas acerca de esta medicina, consulte con su mdico, su farmacutico o su profesional de Radiographer, therapeutic.    2016, Elsevier/Gold Standard. (2014-06-17 00:00:00) Dieta restringida en grasas y colesterol (Fat and Cholesterol Restricted Diet) El exceso de grasas y colesterol en la dieta puede causar problemas de [place]. Esta dieta lo ayudar a Pharmacologist las grasas y Print production planner en los niveles normales para evitar enfermarse. QU TIPOS DE GRASAS DEBO ELEGIR?  Elija grasas monosaturadas y polinsaturadas. Estas se encuentran en alimentos como el aceite de oliva, aceite de canola, semillas de lino, nueces, almendras y semillas.  Consuma ms grasas omega-3. Las mejores opciones incluyen salmn, caballa, sardinas, atn, aceite de lino y semillas de lino molidas.  Limite el consumo de grasas saturadas, que se encuentran en productos de origen animal, como carnes, mantequilla y crema. Tambin pueden estar en productos vegetales, como aceite de palma, de palmiste y de coco.   Evite los alimentos con aceites parcialmente hidrogenados. Estos contienen grasas trans. Entre los ejemplos de alimentos con grasas trans se incluyen margarinas en barra, Environmental health practitioner  untables, galletas dulces o saladas y otros productos horneados. QU PAUTAS GENERALES DEBO SEGUIR?   Lea las etiquetas de los alimentos. Busque las palabras "grasas trans" y "grasas saturadas".  Al preparar una comida: 1. Llene la mitad del plato con verduras y ensaladas de hojas verdes. 2. Llene un cuarto del plato con cereales integrales. Busque la palabra "integral" en Estate agent de la lista de ingredientes. 3. Llene un cuarto del plato con alimentos con protenas magras.  Limite las frutas a dos porciones por da. Elija frutas en lugar de jugos.  Coma ms alimentos con fibra soluble, por ejemplo, manzanas, brcoli,  zanahorias, frijoles, guisantes y [place]. Trate de consumir de 20a 30g (gramos) de Firefighter.  Coma ms comidas caseras. Coma menos en los restaurantes y los bares.  Limite o evite el alcohol.  Limite los alimentos con alto contenido de almidn y International aid/development worker.  Limite el consumo de alimentos fritos.  Cocine los alimentos sin frerlos. Las opciones de coccin ms [place] son Development worker, community, Regulatory affairs officer, Software engineer y asar a Patent attorney.  Baje de peso si es necesario. Aunque pierda [place] [employer], esto puede ser importante para la salud general. Tambin puede ayudar a prevenir enfermedades como diabetes y enfermedad cardaca. QU ALIMENTOS PUEDO COMER? Cereales Cereales integrales, como los panes de salvado o [place], las [place]raz[place], los cereales y las pastas. Avena sin endulzar, trigo, [place], quinua o arroz integral. Tortillas de harina de maz o de salvado. Verduras Verduras frescas o congeladas (crudas, al vapor, asadas o grilladas). Ensaladas de hojas verdes. Nils Pyle Nils Pyle frescas, en conserva (en su jugo natural) o frutas congeladas. Carnes y otros productos con protenas Carne de res molida (al 85% o ms [place]), carne de res de animales alimentados con pastos o carne de res sin la grasa. Pollo o pavo sin piel. Carne de pollo o de [place]. Cerdo sin la grasa. Todos los pescados y frutos de mar. Huevos. Porotos, guisantes o lentejas secos. Frutos secos o semillas sin sal. Frijoles secos o en lata sin sal. Lcteos Productos lcteos con bajo contenido de grasas, como [place] o al 1%, quesos reducidos en grasas o al 2%, ricota con bajo contenido de grasas o [employer], o yogur natural con bajo contenido de [place]. Grasas y [employer] untables que no contengan grasas trans. Mayonesa y condimentos para ensaladas livianos o reducidos en grasas. [place]. Aceites de oliva, canola, ssamo o crtamo. Mantequilla natural de cacahuate o almendra (elija la que no tenga agregado de aceite o  azcar). Los artculos mencionados arriba pueden no ser Raytheon de las bebidas o los alimentos recomendados. Comunquese con el nutricionista para conocer ms opciones. QU ALIMENTOS NO SE RECOMIENDAN? Cereales Pan blanco. Pastas blancas. Arroz blanco. Pan de maz. Bagels, pasteles y croissants. Galletas saladas que contengan grasas trans. Verduras Papas blancas. MazHoover Brunette con crema o fritas. Verduras en salsa de [place]. Nils Pyle Frutas secas. Fruta enlatada en almbar liviano o espeso. Jugo de frutas. Carnes y otros productos con protenas Cortes de carne con Holiday representative. Costillas, alas de pollo, tocineta, salchicha, mortadela, salame, chinchulines, tocino, perros calientes, salchichas alemanas y embutidos envasados. Hgado y otros rganos. Lcteos Leche entera o al 2%, crema, mezcla de [place] y crema, y queso crema. Quesos enteros. Yogur entero o endulzado. Quesos con toda su grasa. [place]remas no lcteas y coberturas batidas. Quesos procesados, quesos para untar o cuajadas. Dulces y postres Jarabe de maz, azcares, miel y Radio broadcast assistant. Caramelos. Mermelada y [place]. Doreen Beam. Cereales endulzados. Galletas, pasteles, bizcochuelos,  donas, muffins y helado. Grasas y [address] Mainaceites Mantequilla, Indiamargarina en barra, Oak[place]eca de Norman Parkcerdo, Murphysborograsa, Singaporemantequilla clarificada o grasa de tocino. Aceites de coco, de palmiste o de palma. Bebidas Alcohol. Bebidas endulzadas (como refrescos, limonadas y bebidas frutales o ponches). Los artculos mencionados arriba pueden no ser Raytheonuna lista completa de las bebidas y los alimentos que se Theatre stage managerdeben evitar. Comunquese con el nutricionista para obtener ms informacin.   Esta informacin no tiene Theme park managercomo fin reemplazar el consejo del mdico. Asegrese de hacerle al mdico cualquier pregunta que tenga.   Document Released: 04/25/2005 Document Revised: 05/16/2014 Elsevier Interactive Patient Education 2016 Ar[employer]vier [employer]. Pilgrim's PrideColesterol (Cholesterol) El colesterol es una sustancia blanca  y cerosa parecida a la grasa que el organismo necesita en pequeas cantidades. El hgado fabrica todo el colesterol que necesita. La sangre lo transporta desde el hgado a travs de los vasos sanguneos. Los depsitos de colesterol (placa) pueden acumularse en las paredes de los vasos sanguneos, lo que ocasiona el estrechamiento y la rigidez de las arterias. Las placas de colesterol aumentan el riesgo de ataque cardaco e ictus.  Aunque sea muy elevado, la concentracin de colesterol no puede percibirse. La nica forma de saber que tiene colesterol alto es mediante un anlisis de Liztonsangre. Una vez que se conocen las concentraciones de Oncologistcolesterol, se Tourist information centre managerdebe llevar un registro de los [place] Hillsresultados de Marshalllos anlisis. Trabaje con el mdico para Goodrich Corporationmantener las concentraciones en el rango deseado.  QU SIGNIFICAN LOS RESULTADOS?  El colesterol total es una medida general de todo el colesterol en Holbrooksangre.  El LDL es el que se conoce como colesterol malo y es el que se deposita en las paredes de las arterias. Su concentracin debe ser baja.  El HDL es el colesterol bueno porque limpia las arterias y arrastra el LDL. Su concentracin debe ser alta.  Los triglicridos son grasas que el cuerpo puede quemar ya sea para energa o para almacenamiento. Las concentraciones altas estn estrechamente vinculadas con las enfermedades cardacas. CULES SON LAS CONCENTRACIONES DE COLESTEROL DESEADAS?  El colesterol total por debajo de 200.  El LDL por debajo de 100 en las personas en riesgo y por debajo de 70 en aquellas que corren un riesgo muy alto.  El HDL por Seychellesarriba de 50es bueno y por Seychellesarriba de 60 es lo mejor.  Los triglicridos por debajo de 150. CMO PUEDO BAJAR EL COLESTEROL?  Dieta Siga los programas de alimentacin que el mdico le indique.  Elija el pescado o la carne blanca de pollo y Sho[place]villepavo, asados u horneados. Limite los cortes grasos de carne roja, los alimentos fritos y las carnes procesadas, como las  salchichas y los embutidos.  Coma gran cantidad de frutas y verduras frescas.  Elija los cereales integrales, los frijoles, las pastas, las papas y los cereales.  Use solamente pequeas cantidades de aceite de oliva, maz o canola.  No coma mantequilla, Turrellmayonesa, margarina o aceites de Fort Loudonpalmiste.  Evite los alimentos que contengan grasas trans.  Tome leche semidescremada o sin grasa y coma yogur y quesos bajos en grasas o sin grasa. Evite la Eastman Kodakleche entera, la crema, los Ash Flathelados, las yemas de [place] y los quesos enteros.  Los postres sanos incluyen la torta ngel, los bocadillos de Streetsborojengibre, las Gaffergalletas con forma de Pollocksvilleanimales, los caramelos duros, los helados de agua y el yogur bajo en grasa o sin grasa. Evite las Hatchmasas, tortas, pasteles y Woodbinegalletas.  Haga actividad fsica. Siga los programas de ejercicios segn las indicaciones del mdico.  Un programa  regular ayuda a Advertising account executive LDL y aumentar el HDL,  y a Art gallery manager.  Haga cosas que aumenten su nivel de [place], por [place], haga [place], salga a caminar o suba y baje las escaleras. Pregntele al mdico cmo puede aumentar la actividad en su vida cotidiana.  Medicamentos. Tome todos los [employer] como le indic el mdico.  El mdico puede recetarle medicamentos para ayudar a Copy colesterol y reducir el riesgo de enfermedades cardacas.  Si tiene varios factores de riesgo, tal vez tenga que tomar medicamentos, incluso si las concentraciones son normales.   Esta informacin no tiene Theme park manager el consejo del mdico. Asegrese de hacerle al mdico cualquier pregunta que tenga.   Document Released: 02/02/2005 Document Revised: 05/16/2014 Elsevier Interactive Patient Education [employer]

## 2015-09-09 NOTE — Progress Notes (Signed)
   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 Because of her vasomotor symptoms she was restarted on hormone replacement therapy last year consisting of Estratest 0.625 mg daily which has resolved her hot flashes, irritability, mood swing. Patient does not have a PCP and she had been on lisinopril 10-12.5 mg tablet daily and that we have been providing refill. She is here to discuss her lab results and review of her record indicated that last year her comprehensive metabolic panel indicated her blood sugar was slightly elevated at 108 and her lipid profile indicated her total cholesterol was elevated at 221 and her LDL was 142 and the remainder of the profile parameters were normal and her CBC was also normal.  Review of her record also indicated that last year her was 238 pounds with a BMI of 42.81 kg/m in her blood pressure was 130/86 and today she was weighing 235 pounds with a blood pressure 130/70 BMI 41.63 kg/m.  Patient is currently on a exercise program which has included nutritional guidance. We had a lengthy discussion of hyperlipidemia and the parameters to have asked her target. We're going to hold off and not start her on a statin at this time since she started aggressive exercise and nutritional program and her triglycerides improved from 151 06/09/2005 although he her LDL is slightly elevated. She was scheduled to return back in August for her annual exam and we will do her fasting blood work and recheck her lipid profile. I have explained to her that if  her LDL continues to rise that we will need to place her on statin. I provided her with information on statin, lipid profile testing, hypertension, exercise program, and nutritional instructions in Spanish.  Greater than 50% of time was spent counseling  and coordinating care of this patient with hypertension, hyperlipidemia, and obesity. Time of face-to-face consultation was 15 minutes

## 2015-09-29 ENCOUNTER — Other Ambulatory Visit: Payer: Self-pay

## 2015-12-16 ENCOUNTER — Encounter: Payer: Self-pay | Admitting: Gynecology

## 2016-01-06 ENCOUNTER — Other Ambulatory Visit (HOSPITAL_COMMUNITY): Payer: Self-pay | Admitting: *Deleted

## 2016-01-06 DIAGNOSIS — N6452 Nipple discharge: Secondary | ICD-10-CM

## 2016-01-06 DIAGNOSIS — N644 Mastodynia: Secondary | ICD-10-CM

## 2016-01-07 ENCOUNTER — Ambulatory Visit
Admission: RE | Admit: 2016-01-07 | Discharge: 2016-01-07 | Disposition: A | Discharge status: Home / Self Care | Payer: No Typology Code available for payment source | Source: Ambulatory Visit | Attending: Obstetrics and Gynecology | Admitting: Obstetrics and Gynecology

## 2016-01-07 ENCOUNTER — Encounter (HOSPITAL_COMMUNITY): Payer: Self-pay

## 2016-01-07 ENCOUNTER — Ambulatory Visit (HOSPITAL_COMMUNITY)
Admission: RE | Admit: 2016-01-07 | Discharge: 2016-01-07 | Disposition: A | Discharge status: Home / Self Care | Payer: Self-pay | Source: Ambulatory Visit | Attending: Obstetrics and Gynecology | Admitting: Obstetrics and Gynecology

## 2016-01-07 VITALS — BP 122/80 | Temp 98.3°F | Ht 62.0 in | Wt 239.2 lb

## 2016-01-07 DIAGNOSIS — N6452 Nipple discharge: Secondary | ICD-10-CM

## 2016-01-07 DIAGNOSIS — Z1239 Encounter for other screening for malignant neoplasm of breast: Secondary | ICD-10-CM

## 2016-01-07 DIAGNOSIS — N644 Mastodynia: Secondary | ICD-10-CM

## 2016-01-07 NOTE — Addendum Note (Signed)
Encounter addended by: Priscille Heidelberghristine P Khaila Velarde, RN on: 01/07/2016  9:31 PM<BR>    Actions taken: Sign clinical note

## 2016-01-07 NOTE — Addendum Note (Signed)
Encounter addended by: Lynnell DikeSabrina H Holland, LPN on: 1/61/09608/31/2017 12:42 PM<BR>    Actions taken: Order Entry activity accessed

## 2016-01-07 NOTE — Progress Notes (Signed)
Complaints of right breast pain and brownish colored sticky discharge when expresses. Patient rates the pain at a 6 out of 10. Patient states the pain comes and goes.   Pap Smear: Pap smear not completed today. Last Pap smear was in 2016 at Dr. Fontaine NoFernandez's office and normal per patient. Per patient has no history of an abnormal Pap smear. Patient has a history of a hysterectomy for ovarian cysts. Patient no longer needs Pap smears due to her history of a hysterectomy for benign reasons per BCCCP and ACOG guidelines. No Pap smear results in EPIC. Hysterectomy documentation is in EPIC.  Physical exam: Breasts Breasts symmetrical. No skin abnormalities bilateral breasts. No nipple retraction bilateral breasts. No nipple discharge left breast. Expressed a brownish/green colored sticky discharge from right breast. Sample of discharge sent to cytology. No lymphadenopathy. No lumps palpated bilateral breasts. Complaints of tenderness under and around areola on exam. [facility] and possible right breast ultrasound. Appointment scheduled for Thursday, January 07, 2016 at 0840.        Pelvic/Bimanual No Pap smear completed today since patient has a history of a hysterectomy for benign reasons. Pap smear not indicated per BCCCP guidelines.   Smoking History: Patient has never smoked.  Patient Navigation: Patient education provided. Access to services provided for patient through [facility]. Spanish interpreter provided.  Used Spanish interpreter [employer] Watkins from [employer].

## 2016-01-07 NOTE — Patient Instructions (Addendum)
Explained breast self awareness [facility]. Let patient know that she doesn't need any further Pap smears due to her history of a hysterectomy for benign reasons. [facility] mamm[facility] and possible right breast ultrasound. Appointment scheduled for Thursday, January 07, 2016 at 0840. Let patient know will follow up with her within the next couple weeks with result of breast discharge cytology by phone. Calissa Saldana-garcia verbalized understanding.  [place], Kathaleen Maserhristine Poll, RN 9:45 AM

## 2016-01-08 ENCOUNTER — Encounter (HOSPITAL_COMMUNITY): Payer: Self-pay | Admitting: *Deleted

## 2016-01-12 ENCOUNTER — Telehealth (HOSPITAL_COMMUNITY): Payer: Self-pay | Admitting: *Deleted

## 2016-01-12 NOTE — Telephone Encounter (Signed)
Telephoned patient at home number and advised patient breast discharge was benign. Patient voiced understanding. Used interpreter Delorise RoyalsJulie Sowell.

## 2016-01-27 ENCOUNTER — Encounter: Payer: Self-pay | Admitting: Gynecology

## 2016-01-27 DIAGNOSIS — Z0289 Encounter for other administrative examinations: Secondary | ICD-10-CM

## 2016-02-02 ENCOUNTER — Encounter: Payer: Self-pay | Admitting: *Deleted

## 2016-09-21 ENCOUNTER — Encounter: Payer: Self-pay | Admitting: Gynecology

## 2017-02-15 ENCOUNTER — Encounter (HOSPITAL_COMMUNITY): Payer: Self-pay | Admitting: Emergency Medicine

## 2017-02-15 DIAGNOSIS — W010XXA Fall on same level from slipping, tripping and stumbling without subsequent striking against object, initial encounter: Secondary | ICD-10-CM | POA: Insufficient documentation

## 2017-02-15 DIAGNOSIS — Y939 Activity, unspecified: Secondary | ICD-10-CM | POA: Insufficient documentation

## 2017-02-15 DIAGNOSIS — Y998 Other external cause status: Secondary | ICD-10-CM | POA: Insufficient documentation

## 2017-02-15 DIAGNOSIS — I10 Essential (primary) hypertension: Secondary | ICD-10-CM | POA: Insufficient documentation

## 2017-02-15 DIAGNOSIS — M545 Low back pain: Secondary | ICD-10-CM | POA: Insufficient documentation

## 2017-02-15 DIAGNOSIS — Z79899 Other long term (current) drug therapy: Secondary | ICD-10-CM | POA: Insufficient documentation

## 2017-02-15 DIAGNOSIS — Y92002 Bathroom of unspecified non-institutional (private) residence single-family (private) house as the place of occurrence of the external cause: Secondary | ICD-10-CM | POA: Insufficient documentation

## 2017-02-15 NOTE — ED Triage Notes (Signed)
Pt states she slipped and fell in her bathroom today  Pt is c/o lower back pain  Pt states about every 10 minutes she gets sharp pains in her lower back that radiate around to the front

## 2017-02-16 ENCOUNTER — Emergency Department (HOSPITAL_COMMUNITY): Payer: No Typology Code available for payment source

## 2017-02-16 ENCOUNTER — Emergency Department (HOSPITAL_COMMUNITY)
Admission: EM | Admit: 2017-02-16 | Discharge: 2017-02-16 | Disposition: A | Discharge status: Home / Self Care | Payer: No Typology Code available for payment source | Attending: Emergency Medicine | Admitting: Emergency Medicine

## 2017-02-16 DIAGNOSIS — M545 Low back pain, unspecified: Secondary | ICD-10-CM

## 2017-02-16 MED ORDER — IBUPROFEN 800 MG PO TABS: 800.0000 mg | ORAL_TABLET | Freq: Three times a day (TID) | ORAL | 0 refills | Status: AC

## 2017-02-16 MED ORDER — CYCLOBENZAPRINE HCL 10 MG PO TABS: 10.0000 mg | ORAL_TABLET | Freq: Three times a day (TID) | ORAL | 0 refills | Status: AC | PRN

## 2017-02-16 MED ORDER — TRAMADOL HCL 50 MG PO TABS: 50.0000 mg | ORAL_TABLET | Freq: Four times a day (QID) | ORAL | 0 refills | Status: AC | PRN

## 2017-02-16 NOTE — ED Notes (Signed)
Patient transported to X-ray 

## 2017-02-16 NOTE — ED Notes (Signed)
ED Provider at bedside. 

## 2017-02-16 NOTE — ED Provider Notes (Signed)
[facility]-EMERGENCY DEPT Provider Note   CSN: [id number] Arrival date & time: 02/15/17  1824     History   Chief Complaint Chief Complaint  Patient presents with  . Fall  . Back Pain    HPI Winola Drum is a 41 y.o. female.  Patient presents to the emergency department for evaluation after a fall. Patient reports that she slipped in her bathroom and fell, landing on her back. She is having pain diffusely across her lower back that becomes a stabbing shooting pain intermittently. Pain does not radiate down the legs. No head injury. No upper back or neck pain or tenderness.      Past Medical History:  Diagnosis Date  . Constipation   . Depressed   . Gestational diabetes   . Hypertension    NO MEDS CURRENTLY  . Pelvic mass     Patient Active Problem List   Diagnosis Date Noted  . Surgical menopause 12/17/2014  . Postoperative anemia due to acute blood loss 01/19/2012  . 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 ABDOMINAL HYSTERECTOMY  01/17/2012   Procedure: HYSTERECTOMY ABDOMINAL;  Surgeon: Laurette Schimke, MD PHD;  Location: [facility] ORS;  Service: Gynecology;;  . CESAREAN SECTION    . SALPINGOOPHORECTOMY  01/17/2012   Procedure: SALPINGO OOPHERECTOMY;  Surgeon: Laurette Schimke, MD PHD;  Location: [facility] ORS;  Service: Gynecology;  Laterality: Bilateral;    OB History    Gravida Para Term Preterm AB Living   SAB TAB Ectopic Multiple Live Births                   Home Medications    Prior to Admission medications   Medication Sig Start Date End Date Taking? Authorizing Provider  acetaminophen (TYLENOL) 500 MG tablet Take 1,000 mg by mouth every 6 (six) hours as needed for moderate pain.   Yes [provider]  Multiple Vitamins-Minerals (EQL MEGA SELECT WOMENS PO) Take 1 tablet by mouth daily.   Yes [provider]  cyclobenzaprine (FLEXERIL) 10 MG tablet Take 1 tablet (10 mg total)  by mouth 3 (three) times daily as needed for muscle spasms. 02/16/17   Gilda Crease, MD  EEMT HS 0.625-1.25 MG tablet TAKE 1 TABLET BY MOUTH EVERY DAY Patient not taking: Reported on 02/16/2017 08/18/15   Ok Edwards, MD  ferrous sulfate 325 (65 FE) MG tablet Take 1 tablet (325 mg total) by mouth 2 (two) times daily before a meal. 01/19/12 09/09/15  Antionette Char, MD  ibuprofen (ADVIL,MOTRIN) 800 MG tablet Take 1 tablet (800 mg total) by mouth 3 (three) times daily. 02/16/17   Gilda Crease, MD  lisinopril-hydrochlorothiazide (PRINZIDE,ZESTORETIC) 10-12.5 MG tablet TAKE 1 TABLET BY MOUTH EVERY DAY Patient not taking: Reported on 02/16/2017 08/18/15   Ok Edwards, MD  predniSONE (DELTASONE) 20 MG tablet Take 3 tabs po qd x 2d, 2 tabs po qd x 2d, 1 tab po qd x 2d, then stop Patient not taking: Reported on 01/07/2016 02/06/15   Sherren Mocha, MD  traMADol (ULTRAM) 50 MG tablet Take 1 tablet (50 mg total) by mouth every 6 (six) hours as needed. 02/16/17   Gilda Crease, MD    Family History Family History  Problem Relation Age of Onset  . Diabetes Mother   . Arthritis Mother     Social History Social History  Substance Use Topics  .  Smoking status: Never Smoker  . Smokeless tobacco: Never Used  . Alcohol use No     Allergies   Patient has no known allergies.   Review of Systems Review of Systems  Musculoskeletal: Positive for back pain.  All other systems reviewed and are negative.    Physical Exam Updated Vital Signs BP 129/76 (BP Location: Left Arm)   Pulse 76   Temp 98.7 F (37.1 C) (Oral)   Resp 16   LMP 01/11/2012   SpO2 97%   Physical Exam  Constitutional: She is oriented to person, place, and time. She appears well-developed and well-nourished. No distress.  HENT:  Head: Normocephalic and atraumatic.  Right Ear: Hearing normal.  Left Ear: Hearing normal.  Nose: Nose normal.  Mouth/Throat: Oropharynx is clear and moist and  mucous membranes are normal.  Eyes: Pupils are equal, round, and reactive to light. Conjunctivae and EOM are normal.  Neck: Normal range of motion. Neck supple.  Cardiovascular: Regular rhythm, S1 normal and S2 normal.  Exam reveals no gallop and no friction rub.   No murmur heard. Pulmonary/Chest: Effort normal and breath sounds normal. No respiratory distress. She exhibits no tenderness.  Abdominal: Soft. Normal appearance and bowel sounds are normal. There is no hepatosplenomegaly. There is no tenderness. There is no rebound, no guarding, no tenderness at McBurney's point and negative Murphy's sign. No hernia.  Musculoskeletal: Normal range of motion.       Lumbar back: She exhibits tenderness.       Back:  Neurological: She is alert and oriented to person, place, and time. She has normal strength. No cranial nerve deficit or sensory deficit. Coordination normal. GCS eye subscore is 4. GCS verbal subscore is 5. GCS motor subscore is 6.  Skin: Skin is warm, dry and intact. No rash noted. No cyanosis.  Psychiatric: She has a normal mood and affect. Her speech is normal and behavior is normal. Thought content normal.  Nursing note and vitals reviewed.    ED Treatments / Results  Labs (all labs ordered are listed, but only abnormal results are displayed) Labs Reviewed - No data to display  EKG  EKG Interpretation None       Radiology Dg Lumbar Spine Complete  Result Date: 02/16/2017 CLINICAL DATA:  Larey Seat late last night, striking her back on the floor. Persistent low back pain. EXAM: LUMBAR SPINE - COMPLETE 4+ VIEW COMPARISON:  11/22/2011 FINDINGS: The lumbar vertebrae are normal in height. Slight retrolisthesis at L1-2, unchanged. Moderate lumbar degenerative disc changes, greatest at L1-2. No bone lesion or bony destruction. No spondylolysis. IMPRESSION: Moderate degenerative changes, greatest at L1-2 where there is a mild retrolisthesis. Negative for acute fracture. Electronically  Signed   By: Ellery Plunk M.D.   On: 02/16/2017 03:49    Procedures Procedures (including critical care time)  Medications Ordered in ED Medications - No data to display   Initial Impression / Assessment and Plan / ED Course  I have reviewed the triage vital signs and the nursing notes.  Pertinent labs & imaging results that were available during my care of the patient were reviewed by me and considered in my medical decision making (see chart for details).     Patient presents for evaluation of low back pain after a fall. Pain is nonradicular.  She did not hit her head or injure herself anywhere but her back. There is no upper back or neck pain or tenderness. Lumbar x-ray performed, patient has moderate degenerative changes, but  no evidence of acute fracture.  Final Clinical Impressions(s) / ED Diagnoses   Final diagnoses:  Acute bilateral low back pain without sciatica    New Prescriptions New Prescriptions   CYCLOBENZAPRINE (FLEXERIL) 10 MG TABLET    Take 1 tablet (10 mg total) by mouth 3 (three) times daily as needed for muscle spasms.   IBUPROFEN (ADVIL,MOTRIN) 800 MG TABLET    Take 1 tablet (800 mg total) by mouth 3 (three) times daily.   TRAMADOL (ULTRAM) 50 MG TABLET    Take 1 tablet (50 mg total) by mouth every 6 (six) hours as needed.     Gilda Crease, MD 02/16/17 [id number]

## 2017-02-16 NOTE — ED Notes (Signed)
Returned from xray

## 2019-04-04 IMAGING — CR DG LUMBAR SPINE COMPLETE 4+V
5 series · 5 of 5 positions shown · non-contrast
Comparison: 11/22/2011

CLINICAL DATA: Fell late last night, striking her back on the
floor. Persistent low back pain.

EXAM:
LUMBAR SPINE - COMPLETE 4+ VIEW

[t lumbar spine ap]
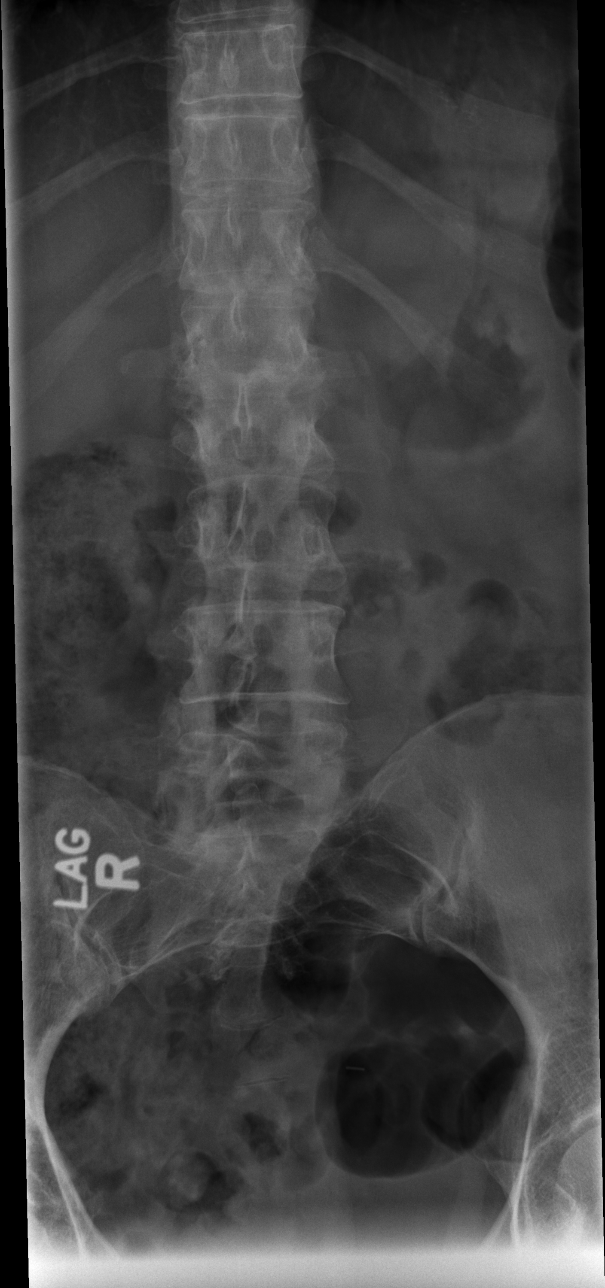

[t lumbar spine obl (1 of 2)]
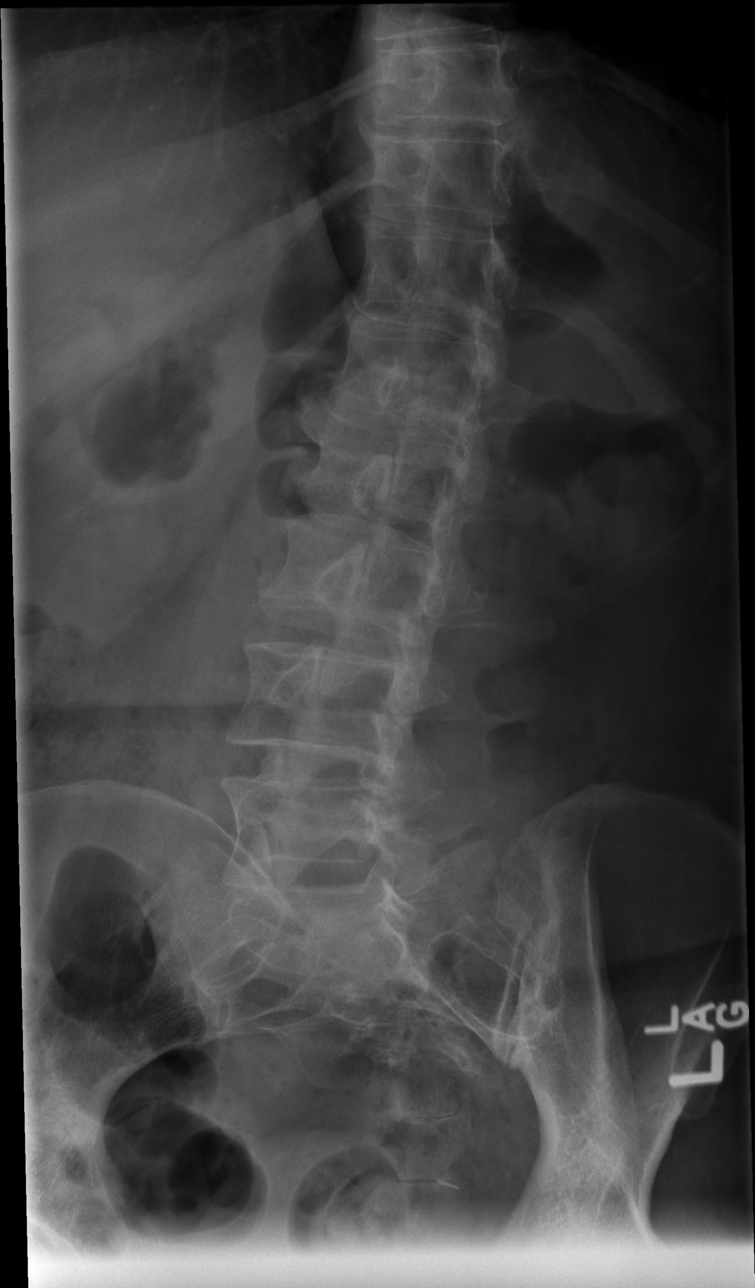

[t lumbar spine obl (2 of 2)]
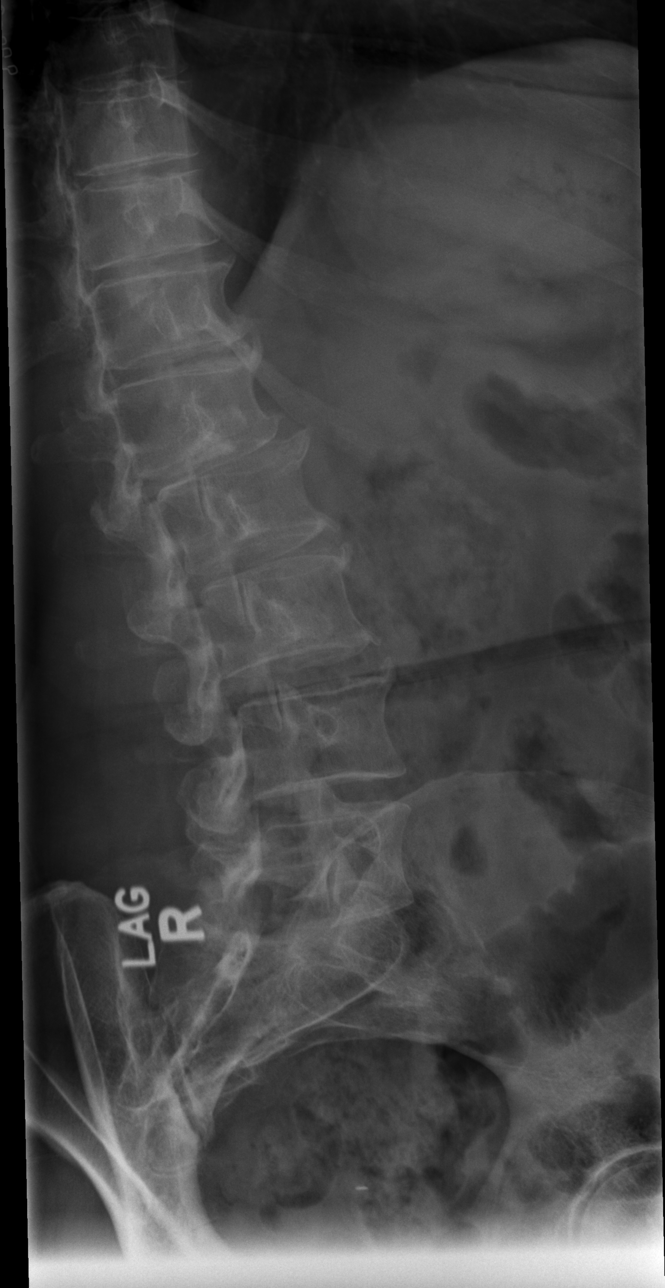

[t lumbar spine lat]
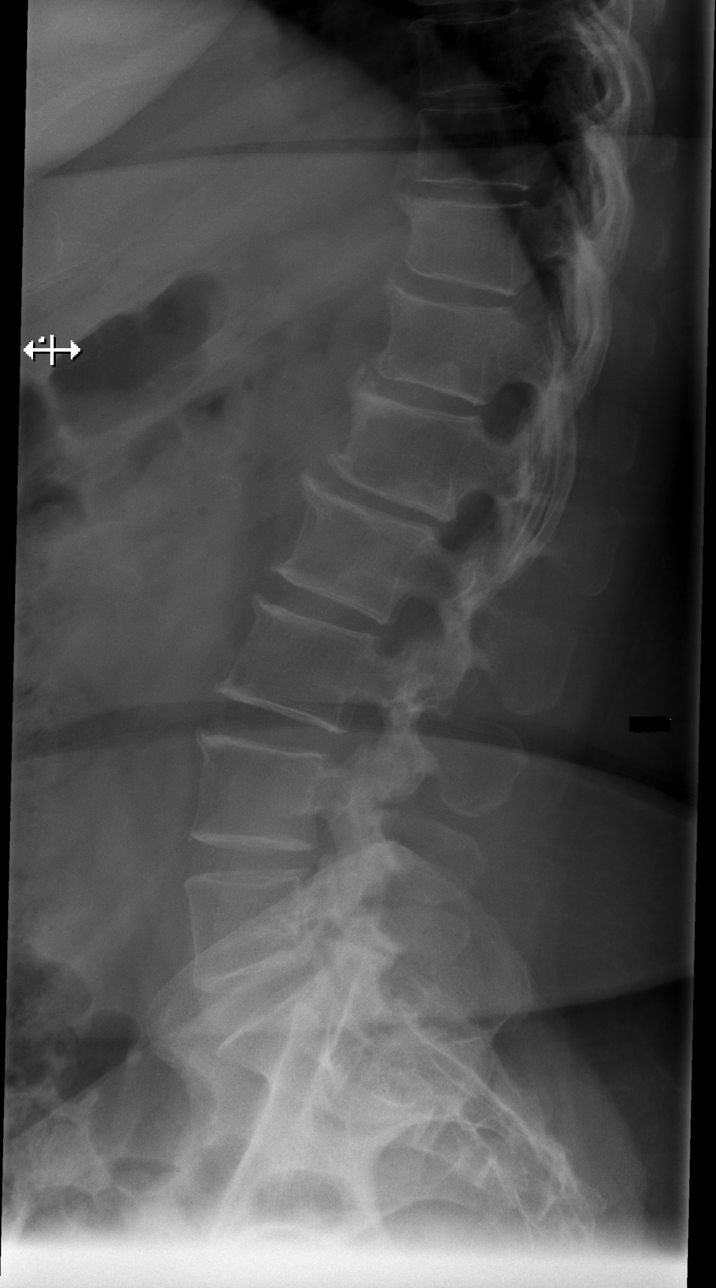

[t lumbar l-5 s-1 spot]
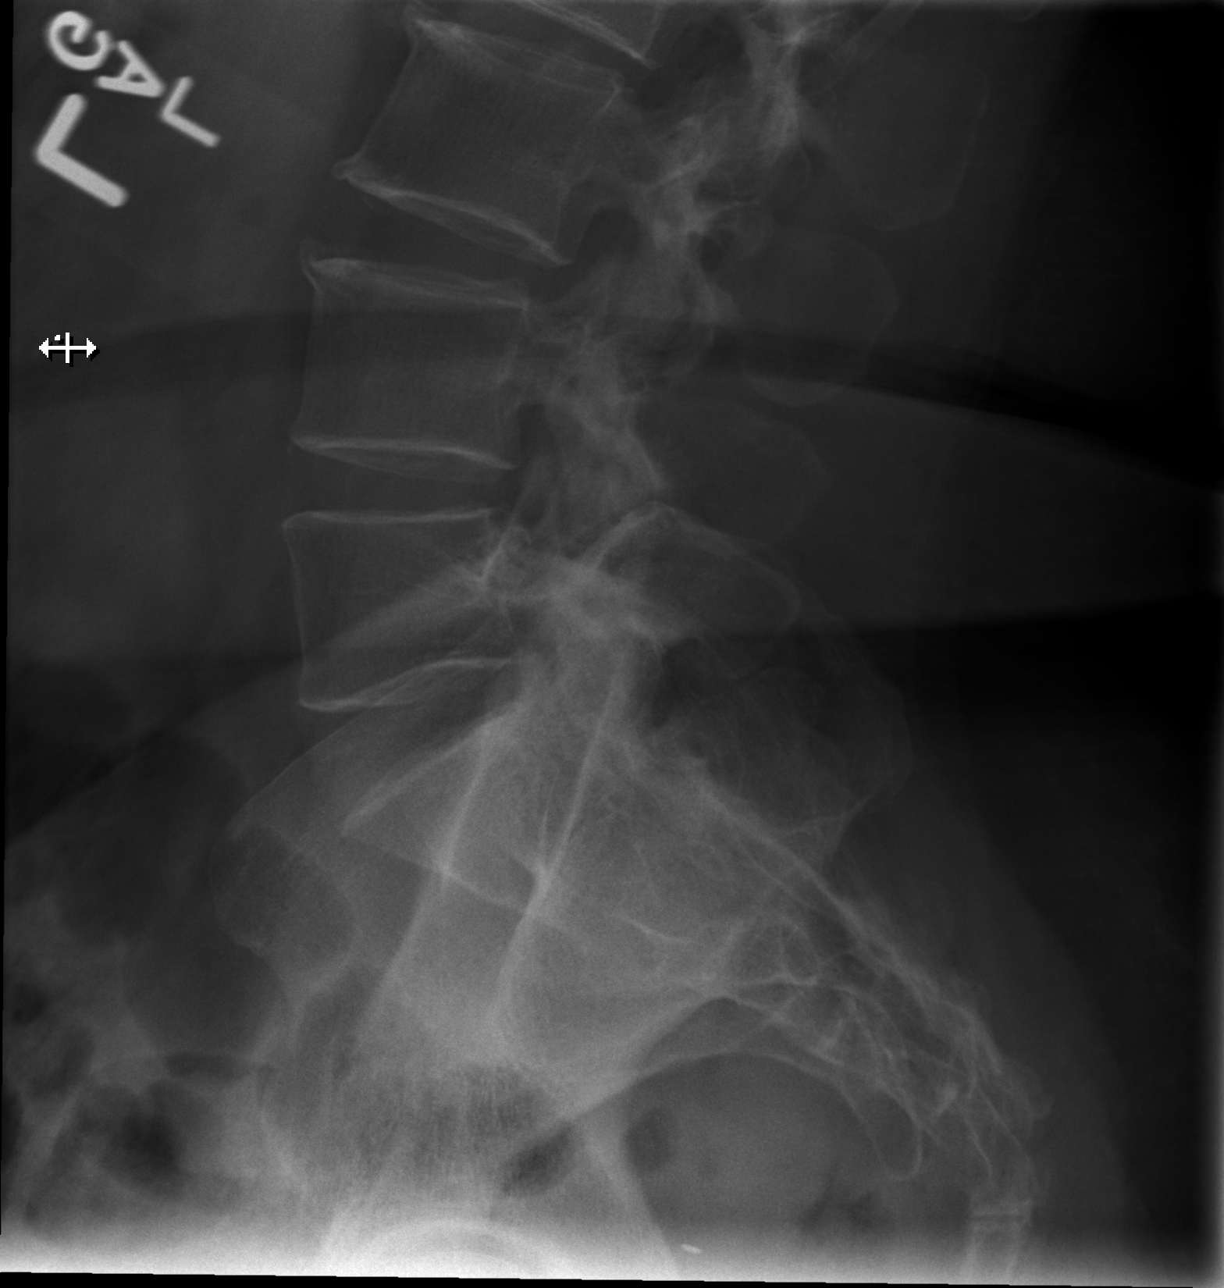

[5 of 5 positions shown; findings below may reference images not displayed]

FINDINGS: The lumbar vertebrae are normal in height. Slight retrolisthesis at
L1-2, unchanged. Moderate lumbar degenerative disc changes, greatest
at L1-2. No bone lesion or bony destruction. No spondylolysis.
IMPRESSION: Moderate degenerative changes, greatest at L1-2 where there is a
mild retrolisthesis. Negative for acute fracture.

## 2022-12-15 ENCOUNTER — Encounter (HOSPITAL_COMMUNITY): Payer: Self-pay

## 2022-12-15 ENCOUNTER — Emergency Department (HOSPITAL_COMMUNITY): Payer: Self-pay

## 2022-12-15 ENCOUNTER — Emergency Department (HOSPITAL_COMMUNITY)
Admission: EM | Admit: 2022-12-15 | Discharge: 2022-12-15 | Disposition: A | Discharge status: Home / Self Care | Payer: Self-pay | Attending: Emergency Medicine | Admitting: Emergency Medicine

## 2022-12-15 DIAGNOSIS — W010XXA Fall on same level from slipping, tripping and stumbling without subsequent striking against object, initial encounter: Secondary | ICD-10-CM | POA: Diagnosis not present

## 2022-12-15 DIAGNOSIS — S41112A Laceration without foreign body of left upper arm, initial encounter: Secondary | ICD-10-CM

## 2022-12-15 DIAGNOSIS — S51812A Laceration without foreign body of left forearm, initial encounter: Secondary | ICD-10-CM | POA: Insufficient documentation

## 2022-12-15 DIAGNOSIS — Y990 Civilian activity done for income or pay: Secondary | ICD-10-CM | POA: Diagnosis not present

## 2022-12-15 DIAGNOSIS — Z23 Encounter for immunization: Secondary | ICD-10-CM | POA: Insufficient documentation

## 2022-12-15 DIAGNOSIS — S59912A Unspecified injury of left forearm, initial encounter: Secondary | ICD-10-CM | POA: Diagnosis present

## 2022-12-15 MED ORDER — HYDROCODONE-ACETAMINOPHEN 5-325 MG PO TABS
1.0000 | ORAL_TABLET | Freq: Once | ORAL | Status: AC
  Administered 2022-12-15: 1 via ORAL
  Filled 2022-12-15: qty 1

## 2022-12-15 MED ORDER — TETANUS-DIPHTH-ACELL PERTUSSIS 5-2.5-18.5 LF-MCG/0.5 IM SUSY
0.5000 mL | PREFILLED_SYRINGE | Freq: Once | INTRAMUSCULAR | Status: AC
  Administered 2022-12-15: 0.5 mL via INTRAMUSCULAR
  Filled 2022-12-15: qty 0.5

## 2022-12-15 MED ORDER — LIDOCAINE-EPINEPHRINE (PF) 2 %-1:200000 IJ SOLN
10.0000 mL | Freq: Once | INTRAMUSCULAR | Status: DC
  Filled 2022-12-15: qty 20

## 2022-12-15 NOTE — Discharge Instructions (Signed)
It was a pleasure taking part in your care today.  As discussed, the x-ray images did not show any obvious fractures.  We repaired your lacerations with sutures.  Please keep sutures covered for the next 24 hours.  After 24 hours, you may remove the dressing that we placed here and you may place your own dressing.  After 4 or 5 days, you may leave the wound open.  Please return to the ED, urgent care or your PCP in 7 to 10 days for suture removal.  Please read the attached guide concerning laceration care and adults for more formation.  You may take ibuprofen or Tylenol at home for pain control.  Fue un Automotive engineer en su atencin hoy.  Como se mencion, las imgenes de rayos X no mostraron ninguna fractura [place].  Reparamos tus laceraciones con suturas.  Mantenga las suturas cubiertas durante las prximas 24 horas.  Despus de 24 horas, podr quitarse el vendaje que le colocamos aqu y podr Scientific laboratory technician su propio vendaje.  Despus de 4 o 5 das, podrs dejar la herida [place].  Regrese al servicio de [place], a atencin de [place] o a su PCP en 7 a 10 das para que le retiren la sutura.  Lea la gua adjunta sobre el cuidado de la laceracin y en adultos para obtener ms informacin.  Puede tomar ibuprofeno o Tylenol en casa para Human resources officer.

## 2022-12-15 NOTE — ED Provider Notes (Signed)
[facility] EMERGENCY DEPARTMENT AT [facility] Provider Note   CSN: [id number] Arrival date & time: 12/15/22  1157     History  Chief Complaint  Patient presents with   Extremity Laceration    Brittany [name] is a 47 y.o. female who presents to the ED for evaluation of left arm lacerations.  Patient reports that prior to arrival she was stepping down concrete stairs when she slipped and fell due to the rain and landed on her left elbow.  She states that she has lacerations to this area, she states that she also broke a flowerpot and she is concerned that she has foreign bodies in her arm.  She is unsure of her last tetanus update.  Denies hitting her head, denies taking blood thinning medication.  She is endorsing pain in her wrist and elbow on the left.  Denies medications prior to arrival.  HPI     Home Medications Prior to Admission medications   Medication Sig Start Date End Date Taking? Authorizing Provider  acetaminophen (TYLENOL) 500 MG tablet Take 1,000 mg by mouth every 6 (six) hours as needed for moderate pain.    [provider]  cyclobenzaprine (FLEXERIL) 10 MG tablet Take 1 tablet (10 mg total) by mouth 3 (three) times daily as needed for muscle spasms. 02/16/17   Gilda Crease, MD  EEMT HS 0.625-1.25 MG tablet TAKE 1 TABLET BY MOUTH EVERY DAY Patient not taking: Reported on 02/16/2017 08/18/15   Ok Edwards, MD  ferrous sulfate 325 (65 FE) MG tablet Take 1 tablet (325 mg total) by mouth 2 (two) times daily before a meal. 01/19/12 09/09/15  Antionette Char, MD  ibuprofen (ADVIL,MOTRIN) 800 MG tablet Take 1 tablet (800 mg total) by mouth 3 (three) times daily. 02/16/17   Gilda Crease, MD  lisinopril-hydrochlorothiazide (PRINZIDE,ZESTORETIC) 10-12.5 MG tablet TAKE 1 TABLET BY MOUTH EVERY DAY Patient not taking: Reported on 02/16/2017 08/18/15   Ok Edwards, MD  Multiple Vitamins-Minerals (EQL MEGA SELECT WOMENS PO)  Take 1 tablet by mouth daily.    [provider]  predniSONE (DELTASONE) 20 MG tablet Take 3 tabs po qd x 2d, 2 tabs po qd x 2d, 1 tab po qd x 2d, then stop Patient not taking: Reported on 01/07/2016 02/06/15   Sherren Mocha, MD  traMADol (ULTRAM) 50 MG tablet Take 1 tablet (50 mg total) by mouth every 6 (six) hours as needed. 02/16/17   Gilda Crease, MD      Allergies    Patient has no known allergies.    Review of Systems   Review of Systems  Musculoskeletal:  Positive for arthralgias and myalgias.  All other systems reviewed and are negative.   Physical Exam Updated Vital Signs BP (!) 166/101 (BP Location: Right Arm)   Pulse 90   Temp 98.1 F (36.7 C) (Oral)   Resp 18   LMP 01/08/2012   SpO2 98%  Physical Exam Vitals and nursing note reviewed.  Constitutional:      General: She is not in acute distress.    Appearance: Normal appearance. She is not ill-appearing, toxic-appearing or diaphoretic.  HENT:     Head: Normocephalic and atraumatic.     Nose: Nose normal.     Mouth/Throat:     Mouth: Mucous membranes are moist.     Pharynx: Oropharynx is clear.  Eyes:     Extraocular Movements: Extraocular movements intact.     Conjunctiva/sclera: Conjunctivae normal.  Pupils: Pupils are equal, round, and reactive to light.  Cardiovascular:     Rate and Rhythm: Normal rate and regular rhythm.  Pulmonary:     Effort: Pulmonary effort is normal.     Breath sounds: Normal breath sounds. No wheezing.  Abdominal:     General: Abdomen is flat. Bowel sounds are normal.     Palpations: Abdomen is soft.     Tenderness: There is no abdominal tenderness.  Musculoskeletal:     Cervical back: Normal range of motion and neck supple. No tenderness.  Skin:    Capillary Refill: Capillary refill takes less than 2 seconds.     Comments: 2 lacerations to left upper extremity. One laceration 1 inch, second laceration 1.5 inches.   Neurological:     Mental Status: She is  alert and oriented to person, place, and time.     ED Results / Procedures / Treatments   Labs (all labs ordered are listed, but only abnormal results are displayed) Labs Reviewed - No data to display  EKG None  Radiology DG Elbow Complete Left  Result Date: 12/15/2022 CLINICAL DATA:  Pain after fall EXAM: LEFT ELBOW - COMPLETE 4 VIEW COMPARISON:  None Available. FINDINGS: There is no evidence of fracture, dislocation, or joint effusion. There is no evidence of arthropathy or other focal bone abnormality. Soft tissues are unremarkable. IMPRESSION: No acute osseous abnormality Electronically Signed   By: Karen Kays M.D.   On: 12/15/2022 13:05   DG Wrist Complete Left  Result Date: 12/15/2022 CLINICAL DATA:  Pain after fall EXAM: LEFT WRIST - COMPLETE 4 VIEW COMPARISON:  None Available. FINDINGS: No acute fracture or dislocation. Preserved joint spaces and bone mineralization. If there is concern further of scaphoid injury recommend treatment with follow-up imaging in 7-10 days to assess for occult abnormality. On the lateral view there is a faint density overlying the soft tissues anteriorly, possibly along the area of the thumb. Please correlate for any evidence of laceration or radiopaque foreign body. IMPRESSION: On the lateral view there is a faint density overlying the soft tissues anteriorly, possibly along the area of the thumb. Please correlate for any evidence of laceration or radiopaque foreign body. This has not well seen on the other views. Electronically Signed   By: Karen Kays M.D.   On: 12/15/2022 13:04    Procedures .Marland KitchenLaceration Repair  Date/Time: 12/15/2022 2:15 PM  Performed by: Al Decant, PA-C Authorized by: Al Decant, PA-C   Consent:    Consent obtained:  Verbal   Consent given by:  Patient   Risks, benefits, and alternatives were discussed: yes     Risks discussed:  Infection, nerve damage, need for additional repair, pain, poor wound healing,  poor cosmetic result, retained foreign body, tendon damage and vascular damage   Alternatives discussed:  No treatment Universal protocol:    Patient identity confirmed:  Verbally with patient and arm band Anesthesia:    Anesthesia method:  Local infiltration   Local anesthetic:  Lidocaine 2% WITH epi Laceration details:    Location:  Shoulder/arm   Shoulder/arm location:  L lower arm   Length (cm):  10 Pre-procedure details:    Preparation:  Patient was prepped and draped in usual sterile fashion Treatment:    Area cleansed with:  Saline   Amount of cleaning:  Standard   Irrigation solution:  Sterile water   Debridement:  None   Undermining:  None Skin repair:    Repair method:  Sutures   Suture size:  3-0 and 4-0   Suture material:  Prolene   Suture technique:  Simple interrupted   Number of sutures:  4 Approximation:    Approximation:  Close Repair type:    Repair type:  Simple Post-procedure details:    Dressing:  Non-adherent dressing   Procedure completion:  Tolerated well, no immediate complications .Marland KitchenLaceration Repair  Date/Time: 12/15/2022 2:16 PM  Performed by: Al Decant, PA-C Authorized by: Al Decant, PA-C   Consent:    Consent obtained:  Verbal   Consent given by:  Patient   Risks discussed:  Infection, need for additional repair, nerve damage, poor wound healing, poor cosmetic result, pain, retained foreign body, tendon damage and vascular damage   Alternatives discussed:  No treatment Universal protocol:    Patient identity confirmed:  Arm band and verbally with patient Anesthesia:    Anesthesia method:  Local infiltration   Local anesthetic:  Lidocaine 2% WITH epi Laceration details:    Location:  Shoulder/arm   Shoulder/arm location:  L lower arm   Length (cm):  15 Treatment:    Amount of cleaning:  Standard   Irrigation solution:  Sterile water Skin repair:    Repair method:  Sutures   Suture size:  4-0   Suture material:   Prolene   Suture technique:  Simple interrupted   Number of sutures:  6 Post-procedure details:    Dressing:  Non-adherent dressing   Procedure completion:  Tolerated well, no immediate complications    Medications Ordered in ED Medications  lidocaine-EPINEPHrine (XYLOCAINE W/EPI) 2 %-1:200000 (PF) injection 10 mL (has no administration in time range)  Tdap (BOOSTRIX) injection 0.5 mL (0.5 mLs Intramuscular Given 12/15/22 1307)  HYDROcodone-acetaminophen (NORCO/VICODIN) 5-325 MG per tablet 1 tablet (1 tablet Oral Given 12/15/22 1307)    ED Course/ Medical Decision Making/ A&P    Medical Decision Making Amount and/or Complexity of Data Reviewed Radiology: ordered.  Risk Prescription drug management.   47 year old female presents to ED for evaluation.  Please see HPI for further details.  On examination patient has a 1.5 inch and a 1 inch laceration to her left upper extremity.  She is unsure of her last tetanus update so this was updated here today.  Patient wound was repaired per procedure note.  Plan for limiting and patient left elbow, left wrist obtained to assess for underlying fractures.  No underlying fractures noted, there was concern for foreign body and patient left palm.  Clinically, there does not appear to be a foreign body in the patient left palm.  This area was inspected.  Patient had the rest of her wounds covered with gauze.  Patient was given hydrocodone for pain control.  Tetanus updated.  Patient had her wounds repaired per procedure note.  Patient will return to ED in 7 to 10 days for suture removal.  Return precautions given and the patient voiced understanding.  Translator utilized for duration of interaction.  Patient stable to discharge home.   Final Clinical Impression(s) / ED Diagnoses Final diagnoses:  Arm laceration, left, initial encounter    Rx / DC Orders ED Discharge Orders     None         Al Decant, PA-C 12/15/22 1419     Loetta Rough, MD 12/15/22 1453

## 2022-12-15 NOTE — ED Triage Notes (Signed)
Pt presents with c/o 2 lacerations to her left forearm from slipping and falling on a flower pot. Two lacerations are noted, both approx 1 inch to 1.5 inches. Bleeding controlled.

## 2022-12-22 ENCOUNTER — Encounter (HOSPITAL_COMMUNITY): Payer: Self-pay

## 2022-12-22 ENCOUNTER — Other Ambulatory Visit: Payer: Self-pay

## 2022-12-22 ENCOUNTER — Emergency Department (HOSPITAL_COMMUNITY)
Admission: EM | Admit: 2022-12-22 | Discharge: 2022-12-23 | Disposition: A | Discharge status: Home / Self Care | Payer: No Typology Code available for payment source | Attending: Emergency Medicine | Admitting: Emergency Medicine

## 2022-12-22 DIAGNOSIS — Z4802 Encounter for removal of sutures: Secondary | ICD-10-CM | POA: Insufficient documentation

## 2022-12-22 DIAGNOSIS — I10 Essential (primary) hypertension: Secondary | ICD-10-CM | POA: Insufficient documentation

## 2022-12-22 NOTE — ED Triage Notes (Signed)
Seeking suture removal.   ~10 in left forearm.

## 2022-12-23 NOTE — ED Provider Notes (Signed)
Brittany [name]   CSN: [id number] Arrival date & time: 12/22/22  2309     History  Chief Complaint  Patient presents with   Suture / Staple Removal    Brittany [name] is a 47 y.o. female.  HPI   Patient with medical history including hypertension, prediabetes, presenting for left arm suture removal, sustained a laceration on the eighth, received 10 sutures, was not placed on antibiotics, she is having no complaints, denies any drainage or discharge from the area denies any pain paresthesia or weakness in the arm.  Reviewed patient's patient 2 separate wounds, total of 10 sutures, tolerated well, was discharged home.  Home Medications Prior to Admission medications   Medication Sig Start Date End Date Taking? Authorizing Provider  acetaminophen (TYLENOL) 500 MG tablet Take 1,000 mg by mouth every 6 (six) hours as needed for moderate pain.    [provider]  cyclobenzaprine (FLEXERIL) 10 MG tablet Take 1 tablet (10 mg total) by mouth 3 (three) times daily as needed for muscle spasms. 02/16/17   Gilda Crease, MD  EEMT HS 0.625-1.25 MG tablet TAKE 1 TABLET BY MOUTH EVERY DAY Patient not taking: Reported on 02/16/2017 08/18/15   Ok Edwards, MD  ferrous sulfate 325 (65 FE) MG tablet Take 1 tablet (325 mg total) by mouth 2 (two) times daily before a meal. 01/19/12 09/09/15  Antionette Char, MD  ibuprofen (ADVIL,MOTRIN) 800 MG tablet Take 1 tablet (800 mg total) by mouth 3 (three) times daily. 02/16/17   Gilda Crease, MD  lisinopril-hydrochlorothiazide (PRINZIDE,ZESTORETIC) 10-12.5 MG tablet TAKE 1 TABLET BY MOUTH EVERY DAY Patient not taking: Reported on 02/16/2017 08/18/15   Ok Edwards, MD  Multiple Vitamins-Minerals (EQL MEGA SELECT WOMENS PO) Take 1 tablet by mouth daily.    [provider]  predniSONE (DELTASONE) 20 MG tablet Take 3 tabs po qd x 2d, 2 tabs po qd x 2d, 1  tab po qd x 2d, then stop Patient not taking: Reported on 01/07/2016 02/06/15   Sherren Mocha, MD  traMADol (ULTRAM) 50 MG tablet Take 1 tablet (50 mg total) by mouth every 6 (six) hours as needed. 02/16/17   Gilda Crease, MD      Allergies    Patient has no known allergies.    Review of Systems   Review of Systems  Constitutional:  Negative for chills and fever.  Respiratory:  Negative for shortness of breath.   Cardiovascular:  Negative for chest pain.  Gastrointestinal:  Negative for abdominal pain.  Skin:  Positive for wound.  Neurological:  Negative for headaches.    Physical Exam Updated Vital Signs BP (!) 156/87   Pulse 80   Temp 98 F (36.7 C)   Resp 18   LMP 01/08/2012   SpO2 98%  Physical Exam Vitals and nursing [name] reviewed.  Constitutional:      General: She is not in acute distress.    Appearance: Normal appearance. She is not ill-appearing or diaphoretic.  HENT:     Head: Normocephalic and atraumatic.     Nose: No congestion or rhinorrhea.  Eyes:     Conjunctiva/sclera: Conjunctivae normal.  Cardiovascular:     Rate and Rhythm: Normal rate.  Pulmonary:     Effort: Pulmonary effort is normal.  Musculoskeletal:     Cervical back: Neck supple.     Comments: Focused exam of the left forearm reveals 2 separate lacerations, with sutures intact,  no surrounding erythema or edema no drainage or discharge.  No fluctuance induration present, wound appears to be healing well.  Skin:    General: Skin is warm and dry.  Neurological:     Mental Status: She is alert.  Psychiatric:        Mood and Affect: Mood normal.     ED Results / Procedures / Treatments   Labs (all labs ordered are listed, but only abnormal results are displayed) Labs Reviewed - No data to display  EKG None  Radiology No results found.  Procedures .Suture Removal  Date/Time: 12/23/2022 3:14 AM  Performed by: Carroll Sage, PA-C Authorized by: Carroll Sage,  PA-C   Consent:    Consent obtained:  Verbal   Consent given by:  Patient   Risks, benefits, and alternatives were discussed: yes     Risks discussed:  Bleeding, pain and wound separation   Alternatives discussed:  No treatment, delayed treatment, alternative treatment, observation and referral Universal protocol:    Patient identity confirmed:  Verbally with patient Location:    Location:  Upper extremity   Upper extremity location:  Arm   Arm location:  L lower arm Procedure details:    Wound appearance:  No signs of infection   Number of sutures removed:  10 Post-procedure details:    Post-removal:  Dressing applied and Steri-Strips applied   Procedure completion:  Tolerated well, no immediate complications     Medications Ordered in ED Medications - No data to display  ED Course/ Medical Decision Making/ A&P                                 Medical Decision Making  This patient presents to the ED for concern of suture removal, this involves an extensive number of treatment options, and is a complaint that carries with it a high risk of complications and morbidity.  The differential diagnosis includes cellulitis, abscess, compartment syndrome    Additional history obtained:  Additional history obtained from family at bedside External records from outside source obtained and reviewed including ER [name]   Co morbidities that complicate the patient evaluation  Prediabetes  Social Determinants of Health:  No PCP    Lab Tests:  I Ordered, and personally interpreted labs.  The pertinent results include: N/A   Imaging Studies ordered:  I ordered imaging studies including N/A I independently visualized and interpreted imaging which showed N/A I agree with the radiologist interpretation   Cardiac Monitoring:  The patient was maintained on a cardiac monitor.  I personally viewed and interpreted the cardiac monitored which showed an underlying rhythm of:  N/A   Medicines ordered and prescription drug management:  I ordered medication including N/A I have reviewed the patients home medicines and have made adjustments as needed  Critical Interventions:  N/A   Reevaluation:  Presents for suture removal, no evidence of infection my exam, patient was agreement with suture removal, she tolerated well.  One of the wounds opened up slightly after sutures removed, placed a Steri-Strip over to help prevent further separation of the wound, dressing was applied, patient is agreement discharge at this time  Consultations Obtained:  N/A    Test Considered:  N/a    Rule out Suspicion for infection at this time not on my exam there is no erythema edema, no induration or fluctuance present no drainage or discharge noted.  Doubt compartment syndrome  all, arms are soft nontender.    Dispostion and problem list  After consideration of the diagnostic results and the patients response to treatment, I feel that the patent would benefit from discharge.  Suture removal-sutures were removed, there is slight wound separation, Steri-Strip was placed, recommend patient leave on for 2 days, and apply new dressing to the area daily.  continue with basic wound care.            Final Clinical Impression(s) / ED Diagnoses Final diagnoses:  Visit for suture removal    Rx / DC Orders ED Discharge Orders     None         Carroll Sage, PA-C 12/23/22 0318    Palumbo, April, MD 12/23/22 [phone]

## 2022-12-23 NOTE — ED Notes (Signed)
Wound care completed.  Patient provided with supplies for home care

## 2022-12-23 NOTE — Discharge Instructions (Signed)
I have removed the sutures from your wound, I placed a Steri-Strip over the wound to help keep it from opening up.  Please leave it on for the next 2 days.  Afterwards you may remove, I recommend that keeping area clean, and applying a new dressing to daily.  I recommend applying over-the-counter antiseptics like Neosporin.  Please follow-up with your primary as needed.  Come back to the emergency department if you develop chest pain, shortness of breath, severe abdominal pain, uncontrolled nausea, vomiting, diarrhea.
